# Patient Record
Sex: Female | Born: 1985 | Race: White | Hispanic: No | Marital: Married | Smoking: Never smoker
Health system: Southern US, Community
[De-identification: ages and names within clinical notes are randomized; demographics above are authoritative.]

## PROBLEM LIST (undated history)

## (undated) ENCOUNTER — Inpatient Hospital Stay (HOSPITAL_COMMUNITY): Payer: Self-pay

## (undated) HISTORY — PX: NO PAST SURGERIES: SHX2092

---

## 2013-04-27 LAB — OB RESULTS CONSOLE RPR: RPR: NONREACTIVE

## 2013-04-27 LAB — OB RESULTS CONSOLE HEPATITIS B SURFACE ANTIGEN: Hepatitis B Surface Ag: NEGATIVE

## 2013-05-14 ENCOUNTER — Inpatient Hospital Stay (HOSPITAL_COMMUNITY)
Admission: AD | Admit: 2013-05-14 | Discharge: 2013-05-14 | Disposition: A | Discharge status: Home / Self Care | Payer: BC Managed Care – PPO | Source: Ambulatory Visit | Attending: Obstetrics and Gynecology | Admitting: Obstetrics and Gynecology

## 2013-05-14 ENCOUNTER — Encounter (HOSPITAL_COMMUNITY): Payer: Self-pay | Admitting: *Deleted

## 2013-05-14 ENCOUNTER — Inpatient Hospital Stay (HOSPITAL_COMMUNITY): Payer: BC Managed Care – PPO

## 2013-05-14 DIAGNOSIS — R12 Heartburn: Secondary | ICD-10-CM | POA: Insufficient documentation

## 2013-05-14 DIAGNOSIS — O26859 Spotting complicating pregnancy, unspecified trimester: Secondary | ICD-10-CM | POA: Insufficient documentation

## 2013-05-14 DIAGNOSIS — K219 Gastro-esophageal reflux disease without esophagitis: Secondary | ICD-10-CM

## 2013-05-14 DIAGNOSIS — O209 Hemorrhage in early pregnancy, unspecified: Secondary | ICD-10-CM

## 2013-05-14 DIAGNOSIS — O210 Mild hyperemesis gravidarum: Secondary | ICD-10-CM

## 2013-05-14 DIAGNOSIS — O219 Vomiting of pregnancy, unspecified: Secondary | ICD-10-CM

## 2013-05-14 DIAGNOSIS — O30009 Twin pregnancy, unspecified number of placenta and unspecified number of amniotic sacs, unspecified trimester: Secondary | ICD-10-CM | POA: Insufficient documentation

## 2013-05-14 LAB — URINE MICROSCOPIC-ADD ON

## 2013-05-14 LAB — URINALYSIS, ROUTINE W REFLEX MICROSCOPIC
Ketones, ur: 15 mg/dL — AB
Leukocytes, UA: NEGATIVE
Nitrite: NEGATIVE
Protein, ur: NEGATIVE mg/dL
Urobilinogen, UA: 2 mg/dL — ABNORMAL HIGH (ref 0.0–1.0)
pH: 6 (ref 5.0–8.0)

## 2013-05-14 MED ORDER — FAMOTIDINE 20 MG PO TABS: 20.0000 mg | ORAL_TABLET | Freq: Two times a day (BID) | ORAL | Status: DC

## 2013-05-14 MED ORDER — LACTATED RINGERS IV BOLUS (SEPSIS)
1000.0000 mL | Freq: Once | INTRAVENOUS | Status: AC
  Administered 2013-05-14: 1000 mL via INTRAVENOUS

## 2013-05-14 NOTE — MAU Provider Note (Signed)
History     CSN: [id number]  Arrival date and time: 05/14/13 1046   First Provider Initiated Contact with Patient 05/14/13 1120      Chief Complaint  Patient presents with  . Vaginal Bleeding   HPI Ms. Catherine Padilla is a 27 y.o. G1P0 at [redacted]w[redacted]d with twins who presents to MAU today with complaint of spotting. The patient states that she had some spotting on Friday and then again this morning. Today she noted dark blood with wiping only. She feels there is less now. She is also having mild lower abdominal cramping. She has had N/V consistently throughout the pregnancy that is well-controlled with Zofran. She states that she has not been doing a good job of hydrating herself recently. She is having constipation and takes Miralax. She denies UTI symptoms or fever. Last intercourse was last Sunday.   OB History   Grav Para Term Preterm Abortions TAB SAB Ect Mult Living   1               History reviewed. No pertinent past medical history.  History reviewed. No pertinent past surgical history.  History reviewed. No pertinent family history.  History  Substance Use Topics  . Smoking status: Never Smoker   . Smokeless tobacco: Not on file  . Alcohol Use: No    Allergies: No Known Allergies  Prescriptions prior to admission  Medication Sig Dispense Refill  . Ondansetron HCl (ZOFRAN PO) Take 1 each by mouth daily as needed (neasea).      . polyethylene glycol (MIRALAX / GLYCOLAX) packet Take 17 g by mouth daily.      . Prenatal Vit-Fe Fumarate-FA (PRENATAL MULTIVITAMIN) TABS Take 1 tablet by mouth daily at 12 noon.        Review of Systems  Constitutional: Positive for malaise/fatigue. Negative for fever.  Gastrointestinal: Positive for nausea, vomiting, abdominal pain and constipation. Negative for diarrhea.  Genitourinary: Negative for dysuria, urgency and frequency.       + Vaginal discharge, bleeding  Neurological: Negative for dizziness, loss of consciousness and weakness.    Physical Exam   Blood pressure 131/86, pulse 80, temperature 98.2 F (36.8 C), temperature source Oral, resp. rate 18.  Physical Exam  Constitutional: She is oriented to person, place, and time. She appears well-developed and well-nourished. No distress.  HENT:  Head: Normocephalic and atraumatic.  Cardiovascular: Normal rate, regular rhythm and normal heart sounds.   Respiratory: Effort normal and breath sounds normal.  GI: Soft. Bowel sounds are normal. She exhibits no distension and no mass. There is tenderness (mild tenderness to palpation of the lower abdomen). There is no rebound and no guarding.  Genitourinary: Uterus is enlarged. Uterus is not tender. Cervix exhibits discharge (scant mucus discharge noted at cervical os) and friability. Cervix exhibits no motion tenderness. There is bleeding (scant dark [place] blood noted; some active bleeding when cervix touched with probe) around the vagina. Vaginal discharge (small amount of thick white and mucus discharge noted) found.  Neurological: She is alert and oriented to person, place, and time.  Skin: Skin is warm and dry. No erythema.  Psychiatric: She has a normal mood and affect.   Results for orders placed during the hospital encounter of 05/14/13 (from the past 24 hour(s))  URINALYSIS, ROUTINE W REFLEX MICROSCOPIC     Status: Abnormal   Collection Time    05/14/13 11:05 AM      Result Value Range   Color, Urine YELLOW  YELLOW  APPearance HAZY (*) CLEAR   Specific Gravity, Urine >1.030 (*) 1.005 - 1.030   pH 6.0  5.0 - 8.0   Glucose, UA NEGATIVE  NEGATIVE mg/dL   Hgb urine dipstick LARGE (*) NEGATIVE   Bilirubin Urine SMALL (*) NEGATIVE   Ketones, ur 15 (*) NEGATIVE mg/dL   Protein, ur NEGATIVE  NEGATIVE mg/dL   Urobilinogen, UA 2.0 (*) 0.0 - 1.0 mg/dL   Nitrite NEGATIVE  NEGATIVE   Leukocytes, UA NEGATIVE  NEGATIVE  URINE MICROSCOPIC-ADD ON     Status: Abnormal   Collection Time    05/14/13 11:05 AM      Result Value  Range   Squamous Epithelial / LPF MANY (*) RARE   WBC, UA 0-2  <3 WBC/hpf   RBC / HPF 3-6  <3 RBC/hpf   Bacteria, UA FEW (*) RARE   Urine-Other MUCOUS PRESENT    WET PREP, GENITAL     Status: Abnormal   Collection Time    05/14/13 11:25 AM      Result Value Range   Yeast Wet Prep HPF POC NONE SEEN  NONE SEEN   Trich, Wet Prep NONE SEEN  NONE SEEN   Clue Cells Wet Prep HPF POC NONE SEEN  NONE SEEN   WBC, Wet Prep HPF POC FEW (*) NONE SEEN   US Ob Limited  05/14/2013   *RADIOLOGY REPORT*  Clinical Data: Spotting  LIMITED OBSTETRIC ULTRASOUND  Number of Fetuses: 2 Heart Rate: Twin A:  152 bpm,  Twin B:  155  bpm  Movement: Yes Presentation: Variable Placental Location: Fundal and posterior Amniotic Fluid (Subjective): Normal  MATERNAL FINDINGS: Cervix: Closed measuring 2.8 cm by transabdominal sonogram. Uterus/Adnexae:  Marginal subchorionic hemorrhage noted measuring 3.3 x 0.8 cm.  This is superior to the placenta.  IMPRESSION:  1.  Living twin intrauterine gestations with estimated gestational age of [redacted] weeks and 5 days. 2.  Small subchorionic hemorrhage.  Recommend followup with non-emergent complete OB 14+ wk US examination for fetal biometric evaluation and anatomic survey if not already performed.   Original Report Authenticated By: Signa Kell, M.D.     MAU Course  Procedures None  MDM Discussed with Dr. Henderson Cloud. Send patient for OB US to evaluate placenta and cervix.  1 L LR bolus Discussed [place] results with Dr. Henderson Cloud. Ok for discharge with bleeding precautions and follow-up in the office this week.  Assessment and Plan  A: Bleeding in pregnancy Heartburn Nausea and vomiting in pregnancy prior to [redacted] weeks gestation, well-controlled  P: Discharge home Rx for Pepcid sent to patient's pharmacy Discussed diet for GERD Patient encouraged to keep follow-up appointment as scheduled with [facility] or call for earlier appointment if symptoms should change or  worsen Patient encouraged to increase PO hydration as tolerated Patient may return to MAU as needed  Freddi Starr, PA-C  05/14/2013, 1:10 PM

## 2013-05-14 NOTE — MAU Note (Signed)
Pt reports she started having some dark spotting on Friday that went away yesterday. Having more spotting today and some mild cramping. Pt is [redacted] weeks pregnant with twins.

## 2013-05-15 LAB — GC/CHLAMYDIA PROBE AMP: GC Probe RNA: NEGATIVE

## 2013-05-15 LAB — OB RESULTS CONSOLE GC/CHLAMYDIA
Chlamydia: NEGATIVE
Gonorrhea: NEGATIVE

## 2013-05-18 ENCOUNTER — Inpatient Hospital Stay (HOSPITAL_COMMUNITY)
Admission: AD | Admit: 2013-05-18 | Discharge: 2013-05-19 | Disposition: A | Discharge status: Home / Self Care | Payer: BC Managed Care – PPO | Source: Ambulatory Visit | Attending: Obstetrics and Gynecology | Admitting: Obstetrics and Gynecology

## 2013-05-18 ENCOUNTER — Encounter (HOSPITAL_COMMUNITY): Payer: Self-pay | Admitting: *Deleted

## 2013-05-18 DIAGNOSIS — O210 Mild hyperemesis gravidarum: Secondary | ICD-10-CM | POA: Insufficient documentation

## 2013-05-18 DIAGNOSIS — R42 Dizziness and giddiness: Secondary | ICD-10-CM | POA: Insufficient documentation

## 2013-05-18 DIAGNOSIS — O219 Vomiting of pregnancy, unspecified: Secondary | ICD-10-CM

## 2013-05-18 DIAGNOSIS — O30009 Twin pregnancy, unspecified number of placenta and unspecified number of amniotic sacs, unspecified trimester: Secondary | ICD-10-CM | POA: Insufficient documentation

## 2013-05-18 MED ORDER — ONDANSETRON 8 MG/NS 50 ML IVPB
8.0000 mg | Freq: Once | INTRAVENOUS | Status: DC
  Filled 2013-05-18: qty 8

## 2013-05-18 MED ORDER — LACTATED RINGERS IV BOLUS (SEPSIS)
1000.0000 mL | Freq: Once | INTRAVENOUS | Status: AC
  Administered 2013-05-18: 1000 mL via INTRAVENOUS

## 2013-05-18 MED ORDER — ONDANSETRON HCL 4 MG/2ML IJ SOLN: 8.0000 mg | Freq: Once | INTRAMUSCULAR | Status: DC

## 2013-05-18 NOTE — MAU Note (Signed)
Pt reports about 2 hours ago she became really dizzy and felt like she was going to pass out. Had vomiting after the dizzy episode. She took the meds she had for nausea and that has eased up.

## 2013-05-18 NOTE — MAU Note (Signed)
Pt states she felt dizzy about 2115, pt states she felt light headed after driving home from visiting her sister. Pt states she became naueous and shaky and started "throwing up where i couldn't control it"

## 2013-05-19 DIAGNOSIS — O210 Mild hyperemesis gravidarum: Secondary | ICD-10-CM

## 2013-05-19 NOTE — MAU Provider Note (Signed)
History     CSN: [id number]  Arrival date and time: 05/18/13 2300   None     Chief Complaint  Patient presents with  . Dizziness  . Nausea   HPI  Catherine Padilla is a 27 y.o. G1P0 at [redacted]w[redacted]d with twins. She has had a lot of trouble with nausea and vomiting. Today she was feeling lightheaded and dizzy, and then began to get really shaky. She states that the checked her blood sugar home, and it was normal. She states that her blood pressure was a little high as well. She does not remember what the values were. She states that the nausea has gotten better, but that she was concerned about the shaking.   History reviewed. No pertinent past medical history.  Past Surgical History  Procedure Laterality Date  . No past surgeries      Family History  Problem Relation Age of Onset  . Hypertension Mother   . Hypertension Maternal Grandmother   . Diabetes Paternal Grandmother     History  Substance Use Topics  . Smoking status: Never Smoker   . Smokeless tobacco: Not on file  . Alcohol Use: No    Allergies: No Known Allergies  Prescriptions prior to admission  Medication Sig Dispense Refill  . Ondansetron HCl (ZOFRAN PO) Take 1 each by mouth daily as needed (neasea).      . famotidine (PEPCID) 20 MG tablet Take 1 tablet (20 mg total) by mouth 2 (two) times daily.  30 tablet  0  . polyethylene glycol (MIRALAX / GLYCOLAX) packet Take 17 g by mouth daily.      . Prenatal Vit-Fe Fumarate-FA (PRENATAL MULTIVITAMIN) TABS Take 1 tablet by mouth daily at 12 noon.        Review of Systems  Constitutional: Negative for fever and chills.  Eyes: Negative for blurred vision.  Respiratory: Negative for shortness of breath.   Cardiovascular: Negative for chest pain.  Gastrointestinal: Positive for nausea, vomiting and constipation (has started to get better). Negative for diarrhea.  Genitourinary: Negative for dysuria, urgency and frequency.       She had spotting on Wednesday and she was  seen for that. She last had spotting on Tuesday and has not had any since. She has noticed an increase in vaginal discharge.   Musculoskeletal: Negative for myalgias.  Neurological: Positive for dizziness. Negative for headaches (She has not had a headache today, but she has had some this week. ).  Psychiatric/Behavioral: Negative for depression.   Physical Exam   Blood pressure 123/78, pulse 78, temperature 98.6 F (37 C), temperature source Oral, resp. rate 20, height 5\' 6"  (1.676 m), weight 83.915 kg (185 lb), SpO2 100.00%.  Physical Exam  Nursing note and vitals reviewed. Constitutional: She is oriented to person, place, and time. She appears well-developed and well-nourished. No distress.  Cardiovascular: Normal rate.   Respiratory: Effort normal.  GI: Soft. There is no tenderness.  Neurological: She is alert and oriented to person, place, and time.  Skin: Skin is warm and dry.  Psychiatric: She has a normal mood and affect.    MAU Course  Procedures   Dr. Dareen Piano had informed us of the patients pending arrival. Discussed POC at that time. No deviation from POC required  0143: Patient has had 2L IVF, and is feeling better.   Assessment and Plan   1. Nausea and vomiting in pregnancy prior to [redacted] weeks gestation   2. Episodic lightheadedness    Discussed  physiologic changes in pregnancy that can cause dizziness Encouraged increased PO hydration and antiemetic as needed FU with Dr. Dareen Piano as scheduled.   Tawnya Crook 05/19/2013, 12:22 AM

## 2013-06-06 ENCOUNTER — Other Ambulatory Visit: Payer: Self-pay

## 2013-06-06 ENCOUNTER — Other Ambulatory Visit (HOSPITAL_COMMUNITY): Payer: Self-pay | Admitting: Obstetrics and Gynecology

## 2013-06-06 DIAGNOSIS — O30039 Twin pregnancy, monochorionic/diamniotic, unspecified trimester: Secondary | ICD-10-CM

## 2013-06-07 ENCOUNTER — Encounter (HOSPITAL_COMMUNITY): Payer: Self-pay

## 2013-06-07 ENCOUNTER — Other Ambulatory Visit: Payer: Self-pay

## 2013-06-07 ENCOUNTER — Other Ambulatory Visit (HOSPITAL_COMMUNITY): Payer: Self-pay | Admitting: Obstetrics and Gynecology

## 2013-06-07 ENCOUNTER — Ambulatory Visit (HOSPITAL_COMMUNITY)
Admission: RE | Admit: 2013-06-07 | Discharge: 2013-06-07 | Disposition: A | Discharge status: Home / Self Care | Payer: BC Managed Care – PPO | Source: Ambulatory Visit | Attending: Obstetrics and Gynecology | Admitting: Obstetrics and Gynecology

## 2013-06-07 VITALS — BP 127/80 | HR 85 | Wt 181.5 lb

## 2013-06-07 DIAGNOSIS — Z363 Encounter for antenatal screening for malformations: Secondary | ICD-10-CM | POA: Insufficient documentation

## 2013-06-07 DIAGNOSIS — O360990 Maternal care for other rhesus isoimmunization, unspecified trimester, not applicable or unspecified: Secondary | ICD-10-CM | POA: Insufficient documentation

## 2013-06-07 DIAGNOSIS — O358XX0 Maternal care for other (suspected) fetal abnormality and damage, not applicable or unspecified: Secondary | ICD-10-CM | POA: Insufficient documentation

## 2013-06-07 DIAGNOSIS — O30039 Twin pregnancy, monochorionic/diamniotic, unspecified trimester: Secondary | ICD-10-CM

## 2013-06-07 DIAGNOSIS — Z1389 Encounter for screening for other disorder: Secondary | ICD-10-CM | POA: Insufficient documentation

## 2013-06-07 DIAGNOSIS — O30009 Twin pregnancy, unspecified number of placenta and unspecified number of amniotic sacs, unspecified trimester: Secondary | ICD-10-CM | POA: Insufficient documentation

## 2013-06-07 NOTE — Progress Notes (Signed)
KLAIR LEISING  was seen today for an ultrasound appointment.  See full report in [facility].  Comments: Catherine Padilla was seen today due to MC/DA twins with echogenic bowel and growth lag of Twin B.  She had viral serologies drawn yesterday after her ultrasound.  On exam today, a thin separating membrane is noted.  There is about a 1 1/2 week growth lag of Twin B.  Echogenic bowel is appreciated in Twin B that appears as echogenic as adjacent bone.  Some amniotic fluid discordance is noted between both twins.  The Max vercicle pocket around twin A is 4 cm.  The Max verticle pocket around B is over 2 cm, but appears subjectively decreased.  Fetal bladders are clearly visualized in both twins.  Normal umbilical artery waverforms are appreciated in both twins.  We reviewed the differential diagnosis for echogenic bowel to include viral infections, aneuploidy (soft marker for Down syndrome), CF and swollowed blood.  The patient elected to undergo NIPS (cell free fetal DNA) to evaluate for possible aneuploidy.  We also reviewed TTTS, and although criteria for this diagnosis is not currently met, with the growth discordance and fluid discordance feel that close surveillance is warranted.  Impression: MC/DA twin gestation with best dates of 18 0/7 weeks 25% intertwin growth discordance is noted.  Twin A:  Maternal left, female fetus, posterior placenta Normal anatomic fetal survey Fetal bladder visualized.  Normal umbilical artery Doppler waverform. Normal amniotic fluid volume.  Twin B: Maternal right, Breech, posterior placenta Normal fetal anatomy; limited views of the face (lips/nose) and spine obtained due to fetal position Growth lag noted (1-1/1/2 weeks) Amniotic fluid subjectively decreased Normal bladder, normal umbilical artery Doppler waveform  Recommendations: Recommend follow up ultrasound in 1 week to screen for TTTS due to fluid and size discordance (see comments above). If stable, may  decrease frequency of surveillance. Follow up growth scan in 3-4 weeks.  Alpha Gula, MD

## 2013-06-13 ENCOUNTER — Other Ambulatory Visit (HOSPITAL_COMMUNITY): Payer: Self-pay | Admitting: Obstetrics and Gynecology

## 2013-06-13 ENCOUNTER — Other Ambulatory Visit (HOSPITAL_COMMUNITY): Payer: Self-pay | Admitting: Medical Oncology

## 2013-06-13 DIAGNOSIS — O30009 Twin pregnancy, unspecified number of placenta and unspecified number of amniotic sacs, unspecified trimester: Secondary | ICD-10-CM

## 2013-06-14 ENCOUNTER — Encounter (HOSPITAL_COMMUNITY): Payer: Self-pay

## 2013-06-14 ENCOUNTER — Ambulatory Visit (HOSPITAL_COMMUNITY)
Admission: RE | Admit: 2013-06-14 | Discharge: 2013-06-14 | Disposition: A | Discharge status: Home / Self Care | Payer: BC Managed Care – PPO | Source: Ambulatory Visit | Attending: Obstetrics and Gynecology | Admitting: Obstetrics and Gynecology

## 2013-06-14 ENCOUNTER — Other Ambulatory Visit (HOSPITAL_COMMUNITY): Payer: Self-pay | Admitting: Obstetrics and Gynecology

## 2013-06-14 VITALS — BP 115/61 | HR 60 | Wt 184.5 lb

## 2013-06-14 DIAGNOSIS — O358XX0 Maternal care for other (suspected) fetal abnormality and damage, not applicable or unspecified: Secondary | ICD-10-CM | POA: Insufficient documentation

## 2013-06-14 DIAGNOSIS — O30009 Twin pregnancy, unspecified number of placenta and unspecified number of amniotic sacs, unspecified trimester: Secondary | ICD-10-CM

## 2013-06-14 DIAGNOSIS — O30032 Twin pregnancy, monochorionic/diamniotic, second trimester: Secondary | ICD-10-CM

## 2013-06-14 DIAGNOSIS — O365990 Maternal care for other known or suspected poor fetal growth, unspecified trimester, not applicable or unspecified: Secondary | ICD-10-CM | POA: Insufficient documentation

## 2013-06-14 DIAGNOSIS — O4100X0 Oligohydramnios, unspecified trimester, not applicable or unspecified: Secondary | ICD-10-CM | POA: Insufficient documentation

## 2013-06-14 DIAGNOSIS — O30039 Twin pregnancy, monochorionic/diamniotic, unspecified trimester: Secondary | ICD-10-CM

## 2013-06-14 NOTE — Progress Notes (Signed)
[facility] ultrasound  Indication: 27 yr old G1P0 at [redacted]w[redacted]d with monochorionic/diamniotic twin gestation, twin B with lagging growth and echogenic bowel for fetal ultrasound.  Findings: 1. Monochorionic/diamniotic twin gestation; the dividing membrane is seen. 2. Posterior placenta without evidence of previa. 3. Twin A with normal maximum vertical pocket of amniotic fluid; although at 7.6cm is increased for gestational age. Twin B with oligohydramnios with maximum vertical pocket of amniotic fluid of 1.09cm. 4. Normal transvaginal cervical length. 5. Normal stomach and bladder seen in each fetus. 6. Echogenic bowel is again seen in twin B. 7. Twin A has normal umbilical artery Doppler studies; twin B with elevated systolic/diastolic ratio; there is persistent forward flow. 8. Twin B has a velamentous placental cord insertion.  Recommendations: 1. Monochorionic/diamniotic twin gestation: - previously counseled - discussed with fluid and weight discrepancy along with abnormal umbilical artery Doppler studies in twin B there is concern for possible twin twin transfusion syndrome vs twin B with placental insufficiency - discussed at this point I do not feel she meets criteria for TTTS (spoke with Dr. Elisabeth Most from [facility] and she is in agreement) and do not feel she qualifies for intervention at this time - discussed clinical picture is more consistent with placental insufficiency (given normal bladder in twin B and no polyhydramnios in twin A) for twin B for which there is no intervention - regardless this warrants close clinical surveillance - I have recommended the patient stay off of work and follow up on 06/16/13- if clinical picture stable and no evidence of TTTS I feel the patient can return to work - I have also recommended that the patient start an ensure once a day as there is some data that this may be of benefit in TTTS and there is no downside -  discussed if this is felt to be placental insufficiency that we will follow growth closely, but there is no role for decision making prior to viability; once viability is reached will readdress and consider a course of betamethasone and other interventions as appropriate at that time - discussed implications of fetal demise on surviving twin - recommend a fetal echocardiogram asap to help determine if any effects or any cardiac signs of TTTS (tenatively scheduled for 06/16/13) - recommend fetal growth in 1 week  2. Echogenic bowel in twin B: - previously counseled - had viral studies done with primary OB as well as cell free fetal DNA and cystic fibrosis screening- awaiting results - continue to follow fetal growth  Eulis Foster, MD

## 2013-06-14 NOTE — Consult Note (Signed)
MFM consult   27 yr old G1P0 at [redacted]w[redacted]d with monochorionic/diamniotic twin gestation, twin B with lagging growth and echogenic bowel referred by Dr. Tenny Craw for fetal ultrasound and consult.  Ultrasound today shows: monochorionic/diamniotic twin gestation; the dividing membrane is seen. Posterior placenta without evidence of previa. Twin A with normal maximum vertical pocket of amniotic fluid; although at 7.6cm is increased for gestational age. Twin B with oligohydramnios with maximum vertical pocket of amniotic fluid of 1.09cm. Normal transvaginal cervical length. Normal stomach and bladder seen in each fetus. Echogenic bowel is again seen in twin B. Twin A has normal umbilical artery Doppler studies; twin B with elevated systolic/diastolic ratio; there is persistent forward flow. Twin B has a velamentous placental cord insertion.  I counseled the patient as follows: 1. Monochorionic/diamniotic twin gestation: - previously counseled - discussed with fluid and weight discrepancy along with abnormal umbilical artery Doppler studies in twin B there is concern for possible twin twin transfusion syndrome vs twin B with placental insufficiency - discussed at this point I do not feel she meets criteria for TTTS (spoke with Dr. Elisabeth Most from [facility] and she is in agreement) and do not feel she qualifies for intervention at this time - discussed clinical picture is more consistent with placental insufficiency (given normal bladder in twin B and no polyhydramnios in twin A) for twin B for which there is no intervention - regardless this warrants close clinical surveillance - I have recommended the patient stay off of work and follow up on 06/16/13- if clinical picture stable and no evidence of TTTS I feel the patient can return to work - I have also recommended that the patient start an ensure once a day as there is some data that this may be of benefit in TTTS and there is no downside - discussed  if this is felt to be placental insufficiency that we will follow growth closely, but there is no role for decision making prior to viability; once viability is reached will readdress and consider a course of betamethasone and other interventions as appropriate at that time - discussed implications of fetal demise on surviving twin - recommend a fetal echocardiogram asap to help determine if any effects or any cardiac signs of TTTS (tenatively scheduled for 06/16/13) - recommend fetal growth in 1 week  2. Echogenic bowel in twin B: - previously counseled - had viral studies done with primary OB as well as cell free fetal DNA and cystic fibrosis screening- awaiting results - continue to follow fetal growth  I spent 30 minutes in face to face consultation with the patient in addition to time spent on the ultrasound.  Eulis Foster, MD

## 2013-06-16 ENCOUNTER — Other Ambulatory Visit (HOSPITAL_COMMUNITY): Payer: Self-pay | Admitting: Obstetrics and Gynecology

## 2013-06-16 ENCOUNTER — Other Ambulatory Visit: Payer: Self-pay

## 2013-06-16 ENCOUNTER — Ambulatory Visit (HOSPITAL_COMMUNITY)
Admission: RE | Admit: 2013-06-16 | Discharge: 2013-06-16 | Disposition: A | Discharge status: Home / Self Care | Payer: BC Managed Care – PPO | Source: Ambulatory Visit | Attending: Obstetrics and Gynecology | Admitting: Obstetrics and Gynecology

## 2013-06-16 DIAGNOSIS — O30032 Twin pregnancy, monochorionic/diamniotic, second trimester: Secondary | ICD-10-CM

## 2013-06-16 DIAGNOSIS — O26899 Other specified pregnancy related conditions, unspecified trimester: Secondary | ICD-10-CM

## 2013-06-16 DIAGNOSIS — O4100X0 Oligohydramnios, unspecified trimester, not applicable or unspecified: Secondary | ICD-10-CM | POA: Insufficient documentation

## 2013-06-16 DIAGNOSIS — O30009 Twin pregnancy, unspecified number of placenta and unspecified number of amniotic sacs, unspecified trimester: Secondary | ICD-10-CM | POA: Insufficient documentation

## 2013-06-16 DIAGNOSIS — O365990 Maternal care for other known or suspected poor fetal growth, unspecified trimester, not applicable or unspecified: Secondary | ICD-10-CM | POA: Insufficient documentation

## 2013-06-16 DIAGNOSIS — O358XX0 Maternal care for other (suspected) fetal abnormality and damage, not applicable or unspecified: Secondary | ICD-10-CM | POA: Insufficient documentation

## 2013-06-16 NOTE — Progress Notes (Signed)
Catherine Padilla  was seen today for an ultrasound appointment.  See full report in [facility].  Comments: Catherine Padilla returns for follow up due to MC/DA twins with echogenic bowel and an approximately 2 week growth lag of twin B.  She was seen earlier this week - noted to have oligohydramnios of Twin B with elevated UA Dopplers but no evidence of polyhydramnios of Twin A.  Initial impression was that this was likely a placental etiology rather than early TTTS. Toxo, CMV serologies and CF mutations were negative.  Cell free fetal DNA testing is pending.  On exam today, polyhydramnios is noted with Twin A (max verticle pocket of 8 cm).  Oligohydramnios is noted on Twin B (max verticle pocket 1-1.9 cm).  Bladders are visualized in both twins- albeit small in Twin B.  UA Dopplers are somewhat improved compared to earlier this week without evidence of absent or reversed diastolic flow.  Both twins had normal ductus venosus waveforms.  Given the new findings of polyhydramnios of Twin A, have made arrangements for Catherine Padilla to be seen at the [facility] next week.  I suspect that this is most likly a hybrid presentation - early stage TTTS but also some element of placental insufficiency as the etilogy of fetal growth restriction in Twin B.  Impression: MC/DA twin gestation at 74 2/7 weeks Discordant growth with 2 week growth lag of Twin B ( on 7/17) Mild polyhydramnios noted in Twin A (max verticle pocket 8 cm) Oligohydramnios noted in Twin B (max verticle pocket 1-1.9 cm) No absent or reversed diastolic flow on UA Dopplers on either twin. Normal ductus venosus waveforms Bladders visualized in both Twins.  Recommendations: Catherine Padilla will be evaluated at the [facility] center early next week - they prefer to perform fetal echos there (will cancel scheduled fetal echos that were previously scheduled) Follow up for limited ultrasound here next week Growth scan in 2 weeks.  Alpha Gula, MD

## 2013-06-19 ENCOUNTER — Telehealth (HOSPITAL_COMMUNITY): Payer: Self-pay | Admitting: MS"

## 2013-06-19 NOTE — Telephone Encounter (Signed)
Calling patient regarding normal cell free fetal DNA testing result. Left message for patient to return call.   Clydie Braun Kalon Erhardt 06/19/2013 9:41 AM    Called Mickle Asper to discuss her cell free fetal DNA test results.  Mrs. ARDELLA CHHIM had [facility] testing through [facility].  Testing was offered because of previous ultrasound finding of echogenic bowel in twin B.   The patient was identified by name and DOB.  We reviewed that these are within normal limits, showing a less than 1 in 10,000 risk for trisomies 21, 18 and 13.  This testing identifies > 99% of pregnancies with trisomy 21, >98% of pregnancies with trisomy 6, and approximately 80% of pregnancies with trisomy 7. Detection rate is decreased in a twin gestation, and sex chromosome assessment is not performed in a twin gestation.  The false positive rate is <0.1% for all conditions. She understands that this testing does not identify all genetic conditions.  All questions were answered to her satisfaction, she was encouraged to call with additional questions or concerns.  Quinn Plowman, MS Certified Genetic Counselor 06/19/2013 11:10 AM

## 2013-06-20 ENCOUNTER — Other Ambulatory Visit (HOSPITAL_COMMUNITY): Payer: Self-pay | Admitting: Obstetrics and Gynecology

## 2013-06-20 ENCOUNTER — Other Ambulatory Visit: Payer: Self-pay

## 2013-06-20 DIAGNOSIS — O30032 Twin pregnancy, monochorionic/diamniotic, second trimester: Secondary | ICD-10-CM

## 2013-06-21 ENCOUNTER — Ambulatory Visit (HOSPITAL_COMMUNITY)
Admission: RE | Admit: 2013-06-21 | Discharge: 2013-06-21 | Disposition: A | Discharge status: Home / Self Care | Payer: BC Managed Care – PPO | Source: Ambulatory Visit | Attending: Obstetrics and Gynecology | Admitting: Obstetrics and Gynecology

## 2013-06-21 DIAGNOSIS — O30032 Twin pregnancy, monochorionic/diamniotic, second trimester: Secondary | ICD-10-CM

## 2013-06-21 DIAGNOSIS — O4100X0 Oligohydramnios, unspecified trimester, not applicable or unspecified: Secondary | ICD-10-CM | POA: Insufficient documentation

## 2013-06-21 DIAGNOSIS — O365990 Maternal care for other known or suspected poor fetal growth, unspecified trimester, not applicable or unspecified: Secondary | ICD-10-CM | POA: Insufficient documentation

## 2013-06-21 DIAGNOSIS — O358XX0 Maternal care for other (suspected) fetal abnormality and damage, not applicable or unspecified: Secondary | ICD-10-CM | POA: Insufficient documentation

## 2013-06-21 DIAGNOSIS — O30009 Twin pregnancy, unspecified number of placenta and unspecified number of amniotic sacs, unspecified trimester: Secondary | ICD-10-CM | POA: Insufficient documentation

## 2013-06-28 ENCOUNTER — Ambulatory Visit (HOSPITAL_COMMUNITY): Payer: BC Managed Care – PPO

## 2013-06-29 ENCOUNTER — Other Ambulatory Visit (HOSPITAL_COMMUNITY): Payer: Self-pay | Admitting: Obstetrics and Gynecology

## 2013-06-29 DIAGNOSIS — O30039 Twin pregnancy, monochorionic/diamniotic, unspecified trimester: Secondary | ICD-10-CM

## 2013-06-30 ENCOUNTER — Ambulatory Visit (HOSPITAL_COMMUNITY)
Admission: RE | Admit: 2013-06-30 | Discharge: 2013-06-30 | Disposition: A | Discharge status: Home / Self Care | Payer: BC Managed Care – PPO | Source: Ambulatory Visit | Attending: Obstetrics and Gynecology | Admitting: Obstetrics and Gynecology

## 2013-06-30 ENCOUNTER — Other Ambulatory Visit (HOSPITAL_COMMUNITY): Payer: Self-pay | Admitting: Obstetrics and Gynecology

## 2013-06-30 DIAGNOSIS — O4100X0 Oligohydramnios, unspecified trimester, not applicable or unspecified: Secondary | ICD-10-CM | POA: Insufficient documentation

## 2013-06-30 DIAGNOSIS — O365990 Maternal care for other known or suspected poor fetal growth, unspecified trimester, not applicable or unspecified: Secondary | ICD-10-CM | POA: Insufficient documentation

## 2013-06-30 DIAGNOSIS — O30039 Twin pregnancy, monochorionic/diamniotic, unspecified trimester: Secondary | ICD-10-CM

## 2013-06-30 DIAGNOSIS — O358XX0 Maternal care for other (suspected) fetal abnormality and damage, not applicable or unspecified: Secondary | ICD-10-CM | POA: Insufficient documentation

## 2013-06-30 DIAGNOSIS — O30009 Twin pregnancy, unspecified number of placenta and unspecified number of amniotic sacs, unspecified trimester: Secondary | ICD-10-CM | POA: Insufficient documentation

## 2013-07-05 ENCOUNTER — Ambulatory Visit (HOSPITAL_COMMUNITY): Payer: BC Managed Care – PPO

## 2013-07-06 ENCOUNTER — Other Ambulatory Visit (HOSPITAL_COMMUNITY): Payer: Self-pay | Admitting: Obstetrics and Gynecology

## 2013-07-06 DIAGNOSIS — O30009 Twin pregnancy, unspecified number of placenta and unspecified number of amniotic sacs, unspecified trimester: Secondary | ICD-10-CM

## 2013-07-07 ENCOUNTER — Ambulatory Visit (HOSPITAL_COMMUNITY)
Admission: RE | Admit: 2013-07-07 | Discharge: 2013-07-07 | Disposition: A | Discharge status: Home / Self Care | Payer: BC Managed Care – PPO | Source: Ambulatory Visit | Attending: Obstetrics and Gynecology | Admitting: Obstetrics and Gynecology

## 2013-07-07 ENCOUNTER — Encounter (HOSPITAL_COMMUNITY): Payer: Self-pay

## 2013-07-07 DIAGNOSIS — O358XX0 Maternal care for other (suspected) fetal abnormality and damage, not applicable or unspecified: Secondary | ICD-10-CM | POA: Insufficient documentation

## 2013-07-07 DIAGNOSIS — O365990 Maternal care for other known or suspected poor fetal growth, unspecified trimester, not applicable or unspecified: Secondary | ICD-10-CM | POA: Insufficient documentation

## 2013-07-07 DIAGNOSIS — O4100X0 Oligohydramnios, unspecified trimester, not applicable or unspecified: Secondary | ICD-10-CM | POA: Insufficient documentation

## 2013-07-07 DIAGNOSIS — O30009 Twin pregnancy, unspecified number of placenta and unspecified number of amniotic sacs, unspecified trimester: Secondary | ICD-10-CM | POA: Insufficient documentation

## 2013-07-12 ENCOUNTER — Other Ambulatory Visit (HOSPITAL_COMMUNITY): Payer: Self-pay | Admitting: Obstetrics and Gynecology

## 2013-07-12 DIAGNOSIS — O30009 Twin pregnancy, unspecified number of placenta and unspecified number of amniotic sacs, unspecified trimester: Secondary | ICD-10-CM

## 2013-07-14 ENCOUNTER — Ambulatory Visit (HOSPITAL_COMMUNITY)
Admission: RE | Admit: 2013-07-14 | Discharge: 2013-07-14 | Disposition: A | Discharge status: Home / Self Care | Payer: BC Managed Care – PPO | Source: Ambulatory Visit | Attending: Obstetrics and Gynecology | Admitting: Obstetrics and Gynecology

## 2013-07-14 ENCOUNTER — Other Ambulatory Visit (HOSPITAL_COMMUNITY): Payer: Self-pay | Admitting: Obstetrics and Gynecology

## 2013-07-14 VITALS — BP 124/81 | HR 80 | Wt 184.5 lb

## 2013-07-14 DIAGNOSIS — O365990 Maternal care for other known or suspected poor fetal growth, unspecified trimester, not applicable or unspecified: Secondary | ICD-10-CM | POA: Insufficient documentation

## 2013-07-14 DIAGNOSIS — O358XX0 Maternal care for other (suspected) fetal abnormality and damage, not applicable or unspecified: Secondary | ICD-10-CM | POA: Insufficient documentation

## 2013-07-14 DIAGNOSIS — O4100X0 Oligohydramnios, unspecified trimester, not applicable or unspecified: Secondary | ICD-10-CM | POA: Insufficient documentation

## 2013-07-14 DIAGNOSIS — O30009 Twin pregnancy, unspecified number of placenta and unspecified number of amniotic sacs, unspecified trimester: Secondary | ICD-10-CM

## 2013-07-14 NOTE — Progress Notes (Signed)
Catherine Padilla  was seen today for an ultrasound appointment.  See full report in [facility].  Impression: MC/DA twin gestation at 83 2/7 weeks Twin B with fetal growth restriction   A: Normal amniotic fluid volume (max verticle pocket 7 cm)        Bladder visible.      Normal UA Doppler studies.  B: Subjectively decreased fluid (max verticle pocket 2.3 cm)      Bladder visible      Elevated UA Dopplers for gestational age.  No absent or reversed diastolic flow  Recommendations: Continue weekly follow up. Interval growth and Doppler studies next week.  Consider betamethasone series next week.  Alpha Gula, MD

## 2013-07-19 ENCOUNTER — Other Ambulatory Visit: Payer: Self-pay | Admitting: Obstetrics and Gynecology

## 2013-07-20 ENCOUNTER — Other Ambulatory Visit (HOSPITAL_COMMUNITY): Payer: Self-pay | Admitting: Maternal and Fetal Medicine

## 2013-07-20 DIAGNOSIS — O30009 Twin pregnancy, unspecified number of placenta and unspecified number of amniotic sacs, unspecified trimester: Secondary | ICD-10-CM

## 2013-07-21 ENCOUNTER — Ambulatory Visit (HOSPITAL_COMMUNITY)
Admission: RE | Admit: 2013-07-21 | Discharge: 2013-07-21 | Disposition: A | Discharge status: Home / Self Care | Payer: BC Managed Care – PPO | Source: Ambulatory Visit | Attending: Obstetrics and Gynecology | Admitting: Obstetrics and Gynecology

## 2013-07-21 ENCOUNTER — Ambulatory Visit (HOSPITAL_COMMUNITY)
Admission: RE | Admit: 2013-07-21 | Discharge: 2013-07-21 | Disposition: A | Discharge status: Home / Self Care | Payer: BC Managed Care – PPO | Source: Ambulatory Visit

## 2013-07-21 VITALS — BP 140/87 | HR 93 | Wt 188.0 lb

## 2013-07-21 DIAGNOSIS — O30009 Twin pregnancy, unspecified number of placenta and unspecified number of amniotic sacs, unspecified trimester: Secondary | ICD-10-CM | POA: Insufficient documentation

## 2013-07-21 DIAGNOSIS — O365990 Maternal care for other known or suspected poor fetal growth, unspecified trimester, not applicable or unspecified: Secondary | ICD-10-CM | POA: Insufficient documentation

## 2013-07-21 DIAGNOSIS — O358XX0 Maternal care for other (suspected) fetal abnormality and damage, not applicable or unspecified: Secondary | ICD-10-CM | POA: Insufficient documentation

## 2013-07-21 DIAGNOSIS — O4100X0 Oligohydramnios, unspecified trimester, not applicable or unspecified: Secondary | ICD-10-CM | POA: Insufficient documentation

## 2013-07-21 MED ORDER — BETAMETHASONE SOD PHOS & ACET 6 (3-3) MG/ML IJ SUSP
12.0000 mg | Freq: Once | INTRAMUSCULAR | Status: AC
  Administered 2013-07-21: 12 mg via INTRAMUSCULAR
  Filled 2013-07-21: qty 2

## 2013-07-21 NOTE — ED Notes (Signed)
1st dose of BMZ given.  Pt to return to MAU tomorrow for 2nd dose.  Orders signed and held.

## 2013-07-21 NOTE — Progress Notes (Signed)
Catherine Padilla  was seen today for an ultrasound appointment.  See full report in [facility].  Impression: MC/DA twin gestation at 1 2/7 weeks Twin B with fetal growth restriction - previously seen at [facility] - not felt to be consistent with TTTS  A: maternal left, cephalic, posterior placenta      Fetal growth is appropriate (43rd %tile)- 230 g weight gain over 3 weeks      Normal amniotic fluid volume      Normal UA Dopplers for gestational age      Normal bladder visualized  B: maternal right, transverse, posterior placenta      Velamentous placental cord insertion      Estimated fetal weight < 10th %tile (110 g weight gain over 3 weeks)      All biometric measurements < 5th %tile      Subjectively low amniotic fluid volume (max verticle pocket 3 cm)      UA Dopplers normal for gestational age.      Normal Bladder visualized  [name] was given a dose of betamethasone following her clinic visit and will return to MAU tomorrow for her second dose  Recommendations: Recommend follow up next week for UA Dopplers and BPPs. If UA Dopplers elevated, will begin 2x weekly surveillance.    Alpha Gula, MD

## 2013-07-22 ENCOUNTER — Inpatient Hospital Stay (HOSPITAL_COMMUNITY)
Admission: AD | Admit: 2013-07-22 | Discharge: 2013-07-22 | Disposition: A | Discharge status: Home / Self Care | Payer: BC Managed Care – PPO | Source: Ambulatory Visit | Attending: Obstetrics and Gynecology | Admitting: Obstetrics and Gynecology

## 2013-07-22 ENCOUNTER — Ambulatory Visit (HOSPITAL_COMMUNITY): Payer: BC Managed Care – PPO

## 2013-07-22 DIAGNOSIS — O4700 False labor before 37 completed weeks of gestation, unspecified trimester: Secondary | ICD-10-CM | POA: Insufficient documentation

## 2013-07-22 MED ORDER — BETAMETHASONE SOD PHOS & ACET 6 (3-3) MG/ML IJ SUSP
12.0000 mg | Freq: Once | INTRAMUSCULAR | Status: AC
  Administered 2013-07-22: 12 mg via INTRAMUSCULAR
  Filled 2013-07-22: qty 2

## 2013-07-28 ENCOUNTER — Encounter (HOSPITAL_COMMUNITY): Payer: Self-pay

## 2013-07-28 ENCOUNTER — Ambulatory Visit (HOSPITAL_COMMUNITY)
Admission: RE | Admit: 2013-07-28 | Discharge: 2013-07-28 | Disposition: A | Discharge status: Home / Self Care | Payer: BC Managed Care – PPO | Source: Ambulatory Visit | Attending: Obstetrics and Gynecology | Admitting: Obstetrics and Gynecology

## 2013-07-28 DIAGNOSIS — O30009 Twin pregnancy, unspecified number of placenta and unspecified number of amniotic sacs, unspecified trimester: Secondary | ICD-10-CM | POA: Insufficient documentation

## 2013-07-28 DIAGNOSIS — O4100X0 Oligohydramnios, unspecified trimester, not applicable or unspecified: Secondary | ICD-10-CM | POA: Insufficient documentation

## 2013-07-28 DIAGNOSIS — O365990 Maternal care for other known or suspected poor fetal growth, unspecified trimester, not applicable or unspecified: Secondary | ICD-10-CM | POA: Insufficient documentation

## 2013-07-28 DIAGNOSIS — O358XX0 Maternal care for other (suspected) fetal abnormality and damage, not applicable or unspecified: Secondary | ICD-10-CM | POA: Insufficient documentation

## 2013-07-31 ENCOUNTER — Other Ambulatory Visit (HOSPITAL_COMMUNITY): Payer: Self-pay | Admitting: Maternal and Fetal Medicine

## 2013-07-31 DIAGNOSIS — O30009 Twin pregnancy, unspecified number of placenta and unspecified number of amniotic sacs, unspecified trimester: Secondary | ICD-10-CM

## 2013-08-04 ENCOUNTER — Ambulatory Visit (HOSPITAL_COMMUNITY)
Admission: RE | Admit: 2013-08-04 | Discharge: 2013-08-04 | Disposition: A | Discharge status: Home / Self Care | Payer: BC Managed Care – PPO | Source: Ambulatory Visit | Attending: Obstetrics and Gynecology | Admitting: Obstetrics and Gynecology

## 2013-08-04 DIAGNOSIS — O365990 Maternal care for other known or suspected poor fetal growth, unspecified trimester, not applicable or unspecified: Secondary | ICD-10-CM | POA: Insufficient documentation

## 2013-08-04 DIAGNOSIS — O4100X0 Oligohydramnios, unspecified trimester, not applicable or unspecified: Secondary | ICD-10-CM | POA: Insufficient documentation

## 2013-08-04 DIAGNOSIS — O30009 Twin pregnancy, unspecified number of placenta and unspecified number of amniotic sacs, unspecified trimester: Secondary | ICD-10-CM | POA: Insufficient documentation

## 2013-08-04 DIAGNOSIS — O358XX0 Maternal care for other (suspected) fetal abnormality and damage, not applicable or unspecified: Secondary | ICD-10-CM | POA: Insufficient documentation

## 2013-08-04 NOTE — Progress Notes (Signed)
Catherine Padilla was seen for ultrasound appointment today.  Please see [facility] report for details.

## 2013-08-10 LAB — OB RESULTS CONSOLE ABO/RH

## 2013-08-10 LAB — OB RESULTS CONSOLE ANTIBODY SCREEN: Antibody Screen: NEGATIVE

## 2013-08-11 ENCOUNTER — Encounter (HOSPITAL_COMMUNITY): Payer: Self-pay

## 2013-08-11 ENCOUNTER — Other Ambulatory Visit (HOSPITAL_COMMUNITY): Payer: Self-pay | Admitting: Maternal and Fetal Medicine

## 2013-08-11 ENCOUNTER — Ambulatory Visit (HOSPITAL_COMMUNITY)
Admission: RE | Admit: 2013-08-11 | Discharge: 2013-08-11 | Disposition: A | Discharge status: Home / Self Care | Payer: BC Managed Care – PPO | Source: Ambulatory Visit | Attending: Obstetrics and Gynecology | Admitting: Obstetrics and Gynecology

## 2013-08-11 VITALS — BP 131/85 | HR 90 | Wt 192.0 lb

## 2013-08-11 DIAGNOSIS — IMO0002 Reserved for concepts with insufficient information to code with codable children: Secondary | ICD-10-CM

## 2013-08-11 DIAGNOSIS — O4100X0 Oligohydramnios, unspecified trimester, not applicable or unspecified: Secondary | ICD-10-CM | POA: Insufficient documentation

## 2013-08-11 DIAGNOSIS — O30009 Twin pregnancy, unspecified number of placenta and unspecified number of amniotic sacs, unspecified trimester: Secondary | ICD-10-CM | POA: Insufficient documentation

## 2013-08-11 DIAGNOSIS — O358XX0 Maternal care for other (suspected) fetal abnormality and damage, not applicable or unspecified: Secondary | ICD-10-CM | POA: Insufficient documentation

## 2013-08-11 DIAGNOSIS — O365990 Maternal care for other known or suspected poor fetal growth, unspecified trimester, not applicable or unspecified: Secondary | ICD-10-CM | POA: Insufficient documentation

## 2013-08-11 NOTE — Progress Notes (Signed)
Catherine Padilla  was seen today for an ultrasound appointment.  See full report in [facility].  Impression: MC/DA twin gestation at 28 2/7 weeks Twin B with fetal growth restriction - previously seen at [facility] - not felt to be consistent with TTTS Completed course of betamethasone at 24 weeks  A: maternal left, cephalic, posterior placenta      Fetal growth is appropriate (25th %tile)- 238 g weight gain over 3 weeks      Normal amniotic fluid volume      Normal UA Dopplers for gestational age      Normal bladder visualized  B: maternal right, breech, posterior placenta      Velamentous placental cord insertion      Estimated fetal weight < 10th %tile (96 g weight gain over 3 weeks)      All biometric measurements < 5th %tile      Nomral amniotic fluid volume      Elevated UA Dopplers for gestaional age, but no evidence of absent or reversed diastolic flow      Normal Bladder visualized  Recommendations: Recommend follow up next week for UA Dopplers and BPPs. Follow up growth scan in 3 weeks.  Alpha Gula, MD

## 2013-08-18 ENCOUNTER — Ambulatory Visit (HOSPITAL_COMMUNITY)
Admission: RE | Admit: 2013-08-18 | Discharge: 2013-08-18 | Disposition: A | Discharge status: Home / Self Care | Payer: BC Managed Care – PPO | Source: Ambulatory Visit | Attending: Obstetrics and Gynecology | Admitting: Obstetrics and Gynecology

## 2013-08-18 ENCOUNTER — Other Ambulatory Visit (HOSPITAL_COMMUNITY): Payer: Self-pay | Admitting: Maternal and Fetal Medicine

## 2013-08-18 DIAGNOSIS — IMO0002 Reserved for concepts with insufficient information to code with codable children: Secondary | ICD-10-CM

## 2013-08-18 DIAGNOSIS — O30009 Twin pregnancy, unspecified number of placenta and unspecified number of amniotic sacs, unspecified trimester: Secondary | ICD-10-CM

## 2013-08-18 DIAGNOSIS — O4100X0 Oligohydramnios, unspecified trimester, not applicable or unspecified: Secondary | ICD-10-CM | POA: Insufficient documentation

## 2013-08-18 DIAGNOSIS — O358XX0 Maternal care for other (suspected) fetal abnormality and damage, not applicable or unspecified: Secondary | ICD-10-CM | POA: Insufficient documentation

## 2013-08-22 ENCOUNTER — Other Ambulatory Visit (HOSPITAL_COMMUNITY): Payer: Self-pay | Admitting: Maternal and Fetal Medicine

## 2013-08-22 ENCOUNTER — Ambulatory Visit (HOSPITAL_COMMUNITY)
Admission: RE | Admit: 2013-08-22 | Discharge: 2013-08-22 | Disposition: A | Discharge status: Home / Self Care | Payer: BC Managed Care – PPO | Source: Ambulatory Visit | Attending: Obstetrics and Gynecology | Admitting: Obstetrics and Gynecology

## 2013-08-22 DIAGNOSIS — O30009 Twin pregnancy, unspecified number of placenta and unspecified number of amniotic sacs, unspecified trimester: Secondary | ICD-10-CM | POA: Insufficient documentation

## 2013-08-22 DIAGNOSIS — O358XX0 Maternal care for other (suspected) fetal abnormality and damage, not applicable or unspecified: Secondary | ICD-10-CM | POA: Insufficient documentation

## 2013-08-22 DIAGNOSIS — IMO0002 Reserved for concepts with insufficient information to code with codable children: Secondary | ICD-10-CM

## 2013-08-22 DIAGNOSIS — O4100X0 Oligohydramnios, unspecified trimester, not applicable or unspecified: Secondary | ICD-10-CM | POA: Insufficient documentation

## 2013-08-22 NOTE — ED Notes (Signed)
Unable to trace NST. Tracing same FHT's for babies.  BPP ordered.

## 2013-08-25 ENCOUNTER — Ambulatory Visit (HOSPITAL_COMMUNITY)
Admission: RE | Admit: 2013-08-25 | Discharge: 2013-08-25 | Disposition: A | Discharge status: Home / Self Care | Payer: BC Managed Care – PPO | Source: Ambulatory Visit | Attending: Obstetrics and Gynecology | Admitting: Obstetrics and Gynecology

## 2013-08-25 ENCOUNTER — Other Ambulatory Visit (HOSPITAL_COMMUNITY): Payer: Self-pay | Admitting: Maternal and Fetal Medicine

## 2013-08-25 DIAGNOSIS — IMO0002 Reserved for concepts with insufficient information to code with codable children: Secondary | ICD-10-CM

## 2013-08-25 DIAGNOSIS — O30009 Twin pregnancy, unspecified number of placenta and unspecified number of amniotic sacs, unspecified trimester: Secondary | ICD-10-CM

## 2013-08-25 DIAGNOSIS — O4100X0 Oligohydramnios, unspecified trimester, not applicable or unspecified: Secondary | ICD-10-CM | POA: Insufficient documentation

## 2013-08-25 DIAGNOSIS — O358XX0 Maternal care for other (suspected) fetal abnormality and damage, not applicable or unspecified: Secondary | ICD-10-CM | POA: Insufficient documentation

## 2013-08-25 NOTE — Progress Notes (Signed)
Catherine Padilla was seen for ultrasound appointment today.  Please see [facility] report for details.

## 2013-08-29 ENCOUNTER — Ambulatory Visit (HOSPITAL_COMMUNITY)
Admission: RE | Admit: 2013-08-29 | Discharge: 2013-08-29 | Disposition: A | Discharge status: Home / Self Care | Payer: BC Managed Care – PPO | Source: Ambulatory Visit | Attending: Obstetrics and Gynecology | Admitting: Obstetrics and Gynecology

## 2013-08-29 ENCOUNTER — Other Ambulatory Visit (HOSPITAL_COMMUNITY): Payer: Self-pay | Admitting: Maternal and Fetal Medicine

## 2013-08-29 DIAGNOSIS — O30009 Twin pregnancy, unspecified number of placenta and unspecified number of amniotic sacs, unspecified trimester: Secondary | ICD-10-CM

## 2013-08-29 DIAGNOSIS — IMO0002 Reserved for concepts with insufficient information to code with codable children: Secondary | ICD-10-CM

## 2013-08-29 DIAGNOSIS — O4100X0 Oligohydramnios, unspecified trimester, not applicable or unspecified: Secondary | ICD-10-CM | POA: Insufficient documentation

## 2013-08-29 DIAGNOSIS — O358XX0 Maternal care for other (suspected) fetal abnormality and damage, not applicable or unspecified: Secondary | ICD-10-CM | POA: Insufficient documentation

## 2013-08-31 ENCOUNTER — Other Ambulatory Visit (HOSPITAL_COMMUNITY): Payer: Self-pay | Admitting: Maternal and Fetal Medicine

## 2013-08-31 DIAGNOSIS — O30009 Twin pregnancy, unspecified number of placenta and unspecified number of amniotic sacs, unspecified trimester: Secondary | ICD-10-CM

## 2013-08-31 DIAGNOSIS — IMO0002 Reserved for concepts with insufficient information to code with codable children: Secondary | ICD-10-CM

## 2013-09-01 ENCOUNTER — Ambulatory Visit (HOSPITAL_COMMUNITY)
Admission: RE | Admit: 2013-09-01 | Discharge: 2013-09-01 | Disposition: A | Discharge status: Home / Self Care | Payer: BC Managed Care – PPO | Source: Ambulatory Visit | Attending: Obstetrics and Gynecology | Admitting: Obstetrics and Gynecology

## 2013-09-01 DIAGNOSIS — O4100X0 Oligohydramnios, unspecified trimester, not applicable or unspecified: Secondary | ICD-10-CM | POA: Insufficient documentation

## 2013-09-01 DIAGNOSIS — O30009 Twin pregnancy, unspecified number of placenta and unspecified number of amniotic sacs, unspecified trimester: Secondary | ICD-10-CM | POA: Insufficient documentation

## 2013-09-01 DIAGNOSIS — O358XX0 Maternal care for other (suspected) fetal abnormality and damage, not applicable or unspecified: Secondary | ICD-10-CM | POA: Insufficient documentation

## 2013-09-01 DIAGNOSIS — IMO0002 Reserved for concepts with insufficient information to code with codable children: Secondary | ICD-10-CM

## 2013-09-01 MED ORDER — BETAMETHASONE SOD PHOS & ACET 6 (3-3) MG/ML IJ SUSP
12.0000 mg | Freq: Once | INTRAMUSCULAR | Status: AC
  Administered 2013-09-01: 12 mg via INTRAMUSCULAR
  Filled 2013-09-01: qty 2

## 2013-09-01 NOTE — ED Notes (Signed)
2nd course of BMZ started today.  Pt to return to MAU tomorrow for 2nd dose of this course.  Order signed and held.

## 2013-09-01 NOTE — Progress Notes (Signed)
GELISA TIEKEN  was seen today for an ultrasound appointment.  See full report in [facility].  Impression: MC/DA twin gestation at 47 2/7 weeks Twin B with fetal growth restriction - previously seen at [facility] - not felt to be consistent with TTTS Completed course of betamethasone at 24 weeks  A: maternal left, cephalic, posterior placenta      Fetal growth is appropriate (42nd %tile) 571 g weight gain over 3 weeks      Normal amniotic fluid volume      BPP 8/8      Normal UA Dopplers for gestational age      An isolated fluid filled loop of bowel is noted in the pelvis measuring approximatly 1 cm in diameter      Normal bladder visualized  B: maternal right, breech, posterior placenta      Velamentous placental cord insertion      Estimated fetal weight < 10th %tile (222 g weight gain over 3 weeks)      All biometric measurements < 5th %tile      Subjectively low normal amniotic fluid volume (max verticle pocket 2.8 cm)      Elevated UA Dopplers for gestaional age.  Single tracing with intermittent absent diastolic flow.  No reversed flow.      BPP 8/8      Normal Bladder visualized  Recommendations: Will give repeat course of betamethasone. Continue 2x weekly BPPs and UA Doppler studies. Follow up growth in 2-3 weeks.  Would move toward delivery for BPP < 6, reversed diastolic flow on UA Dopplers or poor interval fetal growth. If testing otherwise remains stable, recommend delivery at [redacted] weeks gestation.  Alpha Gula, MD

## 2013-09-02 ENCOUNTER — Inpatient Hospital Stay (HOSPITAL_COMMUNITY)
Admission: AD | Admit: 2013-09-02 | Discharge: 2013-09-02 | Disposition: A | Discharge status: Home / Self Care | Payer: BC Managed Care – PPO | Source: Ambulatory Visit | Attending: Obstetrics and Gynecology | Admitting: Obstetrics and Gynecology

## 2013-09-02 DIAGNOSIS — O4700 False labor before 37 completed weeks of gestation, unspecified trimester: Secondary | ICD-10-CM | POA: Insufficient documentation

## 2013-09-02 MED ORDER — BETAMETHASONE SOD PHOS & ACET 6 (3-3) MG/ML IJ SUSP
12.0000 mg | Freq: Once | INTRAMUSCULAR | Status: AC
  Administered 2013-09-02: 12 mg via INTRAMUSCULAR
  Filled 2013-09-02: qty 2

## 2013-09-02 NOTE — MAU Note (Signed)
Injection only, no pain or bleeding

## 2013-09-04 ENCOUNTER — Other Ambulatory Visit (HOSPITAL_COMMUNITY): Payer: Self-pay | Admitting: Maternal and Fetal Medicine

## 2013-09-04 DIAGNOSIS — O30009 Twin pregnancy, unspecified number of placenta and unspecified number of amniotic sacs, unspecified trimester: Secondary | ICD-10-CM

## 2013-09-04 DIAGNOSIS — IMO0002 Reserved for concepts with insufficient information to code with codable children: Secondary | ICD-10-CM

## 2013-09-05 ENCOUNTER — Encounter (HOSPITAL_COMMUNITY): Payer: Self-pay

## 2013-09-05 ENCOUNTER — Other Ambulatory Visit (HOSPITAL_COMMUNITY): Payer: Self-pay | Admitting: Maternal and Fetal Medicine

## 2013-09-05 ENCOUNTER — Ambulatory Visit (HOSPITAL_COMMUNITY)
Admission: RE | Admit: 2013-09-05 | Discharge: 2013-09-05 | Disposition: A | Discharge status: Home / Self Care | Payer: BC Managed Care – PPO | Source: Ambulatory Visit | Attending: Obstetrics and Gynecology | Admitting: Obstetrics and Gynecology

## 2013-09-05 DIAGNOSIS — IMO0002 Reserved for concepts with insufficient information to code with codable children: Secondary | ICD-10-CM

## 2013-09-05 DIAGNOSIS — O30009 Twin pregnancy, unspecified number of placenta and unspecified number of amniotic sacs, unspecified trimester: Secondary | ICD-10-CM

## 2013-09-05 DIAGNOSIS — O358XX0 Maternal care for other (suspected) fetal abnormality and damage, not applicable or unspecified: Secondary | ICD-10-CM | POA: Insufficient documentation

## 2013-09-05 DIAGNOSIS — O4100X0 Oligohydramnios, unspecified trimester, not applicable or unspecified: Secondary | ICD-10-CM | POA: Insufficient documentation

## 2013-09-07 ENCOUNTER — Other Ambulatory Visit (HOSPITAL_COMMUNITY): Payer: Self-pay | Admitting: Maternal and Fetal Medicine

## 2013-09-07 DIAGNOSIS — O30009 Twin pregnancy, unspecified number of placenta and unspecified number of amniotic sacs, unspecified trimester: Secondary | ICD-10-CM

## 2013-09-07 DIAGNOSIS — IMO0001 Reserved for inherently not codable concepts without codable children: Secondary | ICD-10-CM

## 2013-09-07 DIAGNOSIS — IMO0002 Reserved for concepts with insufficient information to code with codable children: Secondary | ICD-10-CM

## 2013-09-08 ENCOUNTER — Ambulatory Visit (HOSPITAL_COMMUNITY)
Admission: RE | Admit: 2013-09-08 | Discharge: 2013-09-08 | Disposition: A | Discharge status: Home / Self Care | Payer: BC Managed Care – PPO | Source: Ambulatory Visit | Attending: Obstetrics and Gynecology | Admitting: Obstetrics and Gynecology

## 2013-09-08 ENCOUNTER — Inpatient Hospital Stay (HOSPITAL_COMMUNITY)
Admission: AD | Admit: 2013-09-08 | Discharge: 2013-09-12 | DRG: 765 | Disposition: A | Discharge status: Home / Self Care | Payer: BC Managed Care – PPO | Source: Ambulatory Visit | Attending: Obstetrics and Gynecology | Admitting: Obstetrics and Gynecology

## 2013-09-08 ENCOUNTER — Encounter (HOSPITAL_COMMUNITY)
Admission: AD | Disposition: A | Discharge status: Home / Self Care | Payer: Self-pay | Source: Ambulatory Visit | Attending: Obstetrics and Gynecology

## 2013-09-08 ENCOUNTER — Encounter (HOSPITAL_COMMUNITY): Payer: Self-pay

## 2013-09-08 ENCOUNTER — Inpatient Hospital Stay (HOSPITAL_COMMUNITY): Payer: BC Managed Care – PPO | Admitting: Anesthesiology

## 2013-09-08 ENCOUNTER — Encounter (HOSPITAL_COMMUNITY): Payer: Self-pay | Admitting: *Deleted

## 2013-09-08 ENCOUNTER — Encounter (HOSPITAL_COMMUNITY): Payer: BC Managed Care – PPO | Admitting: Anesthesiology

## 2013-09-08 DIAGNOSIS — O360990 Maternal care for other rhesus isoimmunization, unspecified trimester, not applicable or unspecified: Secondary | ICD-10-CM | POA: Diagnosis present

## 2013-09-08 DIAGNOSIS — IMO0002 Reserved for concepts with insufficient information to code with codable children: Secondary | ICD-10-CM

## 2013-09-08 DIAGNOSIS — O3090 Multiple gestation, unspecified, unspecified trimester: Principal | ICD-10-CM | POA: Diagnosis present

## 2013-09-08 DIAGNOSIS — O358XX0 Maternal care for other (suspected) fetal abnormality and damage, not applicable or unspecified: Secondary | ICD-10-CM | POA: Diagnosis present

## 2013-09-08 DIAGNOSIS — O365990 Maternal care for other known or suspected poor fetal growth, unspecified trimester, not applicable or unspecified: Secondary | ICD-10-CM | POA: Diagnosis present

## 2013-09-08 DIAGNOSIS — O30039 Twin pregnancy, monochorionic/diamniotic, unspecified trimester: Secondary | ICD-10-CM

## 2013-09-08 DIAGNOSIS — IMO0001 Reserved for inherently not codable concepts without codable children: Secondary | ICD-10-CM

## 2013-09-08 DIAGNOSIS — O30009 Twin pregnancy, unspecified number of placenta and unspecified number of amniotic sacs, unspecified trimester: Secondary | ICD-10-CM

## 2013-09-08 LAB — CBC
HCT: 38.4 % (ref 36.0–46.0)
Hemoglobin: 13.2 g/dL (ref 12.0–15.0)
MCH: 30.4 pg (ref 26.0–34.0)
MCHC: 34.4 g/dL (ref 30.0–36.0)
MCV: 88.5 fL (ref 78.0–100.0)
RDW: 12.9 % (ref 11.5–15.5)

## 2013-09-08 LAB — RPR: RPR Ser Ql: NONREACTIVE

## 2013-09-08 SURGERY — Surgical Case
Anesthesia: Spinal | Site: Abdomen | Wound class: Clean Contaminated

## 2013-09-08 MED ORDER — HYDROMORPHONE HCL PF 1 MG/ML IJ SOLN: 0.2500 mg | INTRAMUSCULAR | Status: DC | PRN

## 2013-09-08 MED ORDER — DIPHENHYDRAMINE HCL 50 MG/ML IJ SOLN: 12.5000 mg | INTRAMUSCULAR | Status: DC | PRN

## 2013-09-08 MED ORDER — CITRIC ACID-SODIUM CITRATE 334-500 MG/5ML PO SOLN
ORAL | Status: AC
  Filled 2013-09-08: qty 15

## 2013-09-08 MED ORDER — CEFAZOLIN SODIUM-DEXTROSE 2-3 GM-% IV SOLR
2.0000 g | INTRAVENOUS | Status: DC
  Filled 2013-09-08: qty 50

## 2013-09-08 MED ORDER — SENNOSIDES-DOCUSATE SODIUM 8.6-50 MG PO TABS
2.0000 | ORAL_TABLET | ORAL | Status: DC
  Administered 2013-09-09 – 2013-09-11 (×4): 2 via ORAL
  Filled 2013-09-08 (×4): qty 2

## 2013-09-08 MED ORDER — ONDANSETRON HCL 4 MG/2ML IJ SOLN: 4.0000 mg | Freq: Three times a day (TID) | INTRAMUSCULAR | Status: DC | PRN

## 2013-09-08 MED ORDER — OXYTOCIN 10 UNIT/ML IJ SOLN
40.0000 [IU] | INTRAVENOUS | Status: DC | PRN
  Administered 2013-09-08: 40 [IU] via INTRAVENOUS

## 2013-09-08 MED ORDER — NALBUPHINE HCL 10 MG/ML IJ SOLN
5.0000 mg | INTRAMUSCULAR | Status: DC | PRN
  Filled 2013-09-08: qty 1

## 2013-09-08 MED ORDER — ZOLPIDEM TARTRATE 5 MG PO TABS: 5.0000 mg | ORAL_TABLET | Freq: Every evening | ORAL | Status: DC | PRN

## 2013-09-08 MED ORDER — SCOPOLAMINE 1 MG/3DAYS TD PT72
MEDICATED_PATCH | TRANSDERMAL | Status: AC
  Administered 2013-09-08: 1.5 mg via TRANSDERMAL
  Filled 2013-09-08: qty 1

## 2013-09-08 MED ORDER — MEPERIDINE HCL 25 MG/ML IJ SOLN
INTRAMUSCULAR | Status: AC
  Administered 2013-09-08: 6.25 mg via INTRAVENOUS
  Filled 2013-09-08: qty 1

## 2013-09-08 MED ORDER — KETOROLAC TROMETHAMINE 60 MG/2ML IM SOLN
INTRAMUSCULAR | Status: AC
  Administered 2013-09-08: 60 mg via INTRAMUSCULAR
  Filled 2013-09-08: qty 2

## 2013-09-08 MED ORDER — LACTATED RINGERS IV SOLN
INTRAVENOUS | Status: DC
  Administered 2013-09-08: 11:00:00 via INTRAVENOUS

## 2013-09-08 MED ORDER — MEPERIDINE HCL 25 MG/ML IJ SOLN
INTRAMUSCULAR | Status: DC | PRN
  Administered 2013-09-08: 12.5 mg via INTRAVENOUS

## 2013-09-08 MED ORDER — LANOLIN HYDROUS EX OINT: 1.0000 "application " | TOPICAL_OINTMENT | CUTANEOUS | Status: DC | PRN

## 2013-09-08 MED ORDER — INFLUENZA VAC SPLIT QUAD 0.5 ML IM SUSP
0.5000 mL | INTRAMUSCULAR | Status: AC
  Administered 2013-09-09: 0.5 mL via INTRAMUSCULAR
  Filled 2013-09-08: qty 0.5

## 2013-09-08 MED ORDER — METOCLOPRAMIDE HCL 5 MG/ML IJ SOLN: 10.0000 mg | Freq: Three times a day (TID) | INTRAMUSCULAR | Status: DC | PRN

## 2013-09-08 MED ORDER — WITCH HAZEL-GLYCERIN EX PADS: 1.0000 "application " | MEDICATED_PAD | CUTANEOUS | Status: DC | PRN

## 2013-09-08 MED ORDER — SIMETHICONE 80 MG PO CHEW
80.0000 mg | CHEWABLE_TABLET | ORAL | Status: DC | PRN
  Filled 2013-09-08: qty 1

## 2013-09-08 MED ORDER — OXYTOCIN 40 UNITS IN LACTATED RINGERS INFUSION - SIMPLE MED: 62.5000 mL/h | INTRAVENOUS | Status: AC

## 2013-09-08 MED ORDER — TETANUS-DIPHTH-ACELL PERTUSSIS 5-2.5-18.5 LF-MCG/0.5 IM SUSP: 0.5000 mL | Freq: Once | INTRAMUSCULAR | Status: DC

## 2013-09-08 MED ORDER — PRENATAL MULTIVITAMIN CH
1.0000 | ORAL_TABLET | Freq: Every day | ORAL | Status: DC
  Administered 2013-09-09 – 2013-09-12 (×4): 1 via ORAL
  Filled 2013-09-08 (×3): qty 1

## 2013-09-08 MED ORDER — NALOXONE HCL 1 MG/ML IJ SOLN
1.0000 ug/kg/h | INTRAVENOUS | Status: DC | PRN
  Filled 2013-09-08: qty 2

## 2013-09-08 MED ORDER — ONDANSETRON HCL 4 MG/2ML IJ SOLN: 4.0000 mg | INTRAMUSCULAR | Status: DC | PRN

## 2013-09-08 MED ORDER — DIPHENHYDRAMINE HCL 50 MG/ML IJ SOLN: 25.0000 mg | INTRAMUSCULAR | Status: DC | PRN

## 2013-09-08 MED ORDER — IBUPROFEN 600 MG PO TABS
600.0000 mg | ORAL_TABLET | Freq: Four times a day (QID) | ORAL | Status: DC
  Administered 2013-09-09 – 2013-09-12 (×15): 600 mg via ORAL
  Filled 2013-09-08 (×15): qty 1

## 2013-09-08 MED ORDER — ONDANSETRON HCL 4 MG PO TABS: 4.0000 mg | ORAL_TABLET | ORAL | Status: DC | PRN

## 2013-09-08 MED ORDER — SODIUM CHLORIDE 0.9 % IJ SOLN: 3.0000 mL | INTRAMUSCULAR | Status: DC | PRN

## 2013-09-08 MED ORDER — NALOXONE HCL 0.4 MG/ML IJ SOLN: 0.4000 mg | INTRAMUSCULAR | Status: DC | PRN

## 2013-09-08 MED ORDER — MEPERIDINE HCL 25 MG/ML IJ SOLN
6.2500 mg | INTRAMUSCULAR | Status: AC | PRN
  Administered 2013-09-08 (×2): 6.25 mg via INTRAVENOUS

## 2013-09-08 MED ORDER — LACTATED RINGERS IV SOLN: INTRAVENOUS | Status: DC

## 2013-09-08 MED ORDER — CITRIC ACID-SODIUM CITRATE 334-500 MG/5ML PO SOLN
30.0000 mL | Freq: Once | ORAL | Status: AC
  Administered 2013-09-08: 30 mL via ORAL

## 2013-09-08 MED ORDER — FENTANYL CITRATE 0.05 MG/ML IJ SOLN
INTRAMUSCULAR | Status: DC | PRN
  Administered 2013-09-08: 25 ug via INTRATHECAL

## 2013-09-08 MED ORDER — SIMETHICONE 80 MG PO CHEW
80.0000 mg | CHEWABLE_TABLET | ORAL | Status: DC
  Administered 2013-09-11: 80 mg via ORAL
  Filled 2013-09-08 (×4): qty 1

## 2013-09-08 MED ORDER — CEFAZOLIN SODIUM-DEXTROSE 2-3 GM-% IV SOLR
INTRAVENOUS | Status: DC | PRN
  Administered 2013-09-08: 2 g via INTRAVENOUS

## 2013-09-08 MED ORDER — ONDANSETRON HCL 4 MG/2ML IJ SOLN
INTRAMUSCULAR | Status: DC | PRN
  Administered 2013-09-08: 4 mg via INTRAMUSCULAR

## 2013-09-08 MED ORDER — OXYCODONE-ACETAMINOPHEN 5-325 MG PO TABS: 1.0000 | ORAL_TABLET | ORAL | Status: DC | PRN

## 2013-09-08 MED ORDER — DIPHENHYDRAMINE HCL 25 MG PO CAPS: 25.0000 mg | ORAL_CAPSULE | Freq: Four times a day (QID) | ORAL | Status: DC | PRN

## 2013-09-08 MED ORDER — LACTATED RINGERS IV SOLN
INTRAVENOUS | Status: DC
  Administered 2013-09-08: 19:00:00 via INTRAVENOUS

## 2013-09-08 MED ORDER — PHENYLEPHRINE HCL 10 MG/ML IJ SOLN
INTRAMUSCULAR | Status: DC | PRN
  Administered 2013-09-08: 40 ug via INTRAVENOUS
  Administered 2013-09-08: 80 ug via INTRAVENOUS

## 2013-09-08 MED ORDER — CITRIC ACID-SODIUM CITRATE 334-500 MG/5ML PO SOLN
ORAL | Status: AC
  Administered 2013-09-08: 30 mL
  Filled 2013-09-08: qty 15

## 2013-09-08 MED ORDER — KETOROLAC TROMETHAMINE 60 MG/2ML IM SOLN
60.0000 mg | Freq: Once | INTRAMUSCULAR | Status: AC | PRN
  Administered 2013-09-08: 60 mg via INTRAMUSCULAR

## 2013-09-08 MED ORDER — PHENYLEPHRINE HCL 10 MG/ML IJ SOLN
20000.0000 ug | INTRAVENOUS | Status: DC | PRN
  Administered 2013-09-08: 60 ug/min via INTRAVENOUS

## 2013-09-08 MED ORDER — MENTHOL 3 MG MT LOZG: 1.0000 | LOZENGE | OROMUCOSAL | Status: DC | PRN

## 2013-09-08 MED ORDER — DIPHENHYDRAMINE HCL 25 MG PO CAPS: 25.0000 mg | ORAL_CAPSULE | ORAL | Status: DC | PRN

## 2013-09-08 MED ORDER — KETOROLAC TROMETHAMINE 30 MG/ML IJ SOLN: 30.0000 mg | Freq: Four times a day (QID) | INTRAMUSCULAR | Status: AC | PRN

## 2013-09-08 MED ORDER — CITRIC ACID-SODIUM CITRATE 334-500 MG/5ML PO SOLN: 30.0000 mL | Freq: Once | ORAL | Status: DC

## 2013-09-08 MED ORDER — SIMETHICONE 80 MG PO CHEW
80.0000 mg | CHEWABLE_TABLET | Freq: Three times a day (TID) | ORAL | Status: DC
  Administered 2013-09-09 – 2013-09-12 (×12): 80 mg via ORAL
  Filled 2013-09-08 (×10): qty 1

## 2013-09-08 MED ORDER — MORPHINE SULFATE (PF) 0.5 MG/ML IJ SOLN
INTRAMUSCULAR | Status: DC | PRN
  Administered 2013-09-08: .15 mg via INTRATHECAL

## 2013-09-08 MED ORDER — SCOPOLAMINE 1 MG/3DAYS TD PT72
1.0000 | MEDICATED_PATCH | Freq: Once | TRANSDERMAL | Status: AC
  Administered 2013-09-08: 1.5 mg via TRANSDERMAL

## 2013-09-08 MED ORDER — LACTATED RINGERS IV SOLN
INTRAVENOUS | Status: DC | PRN
  Administered 2013-09-08 (×3): via INTRAVENOUS

## 2013-09-08 MED ORDER — DIBUCAINE 1 % RE OINT
1.0000 "application " | TOPICAL_OINTMENT | RECTAL | Status: DC | PRN
  Filled 2013-09-08: qty 28

## 2013-09-08 SURGICAL SUPPLY — 25 items
CLAMP CORD UMBIL (MISCELLANEOUS) ×4 IMPLANT
CLOTH BEACON ORANGE TIMEOUT ST (SAFETY) ×2 IMPLANT
DERMABOND ADVANCED (GAUZE/BANDAGES/DRESSINGS) ×1
DERMABOND ADVANCED .7 DNX12 (GAUZE/BANDAGES/DRESSINGS) ×1 IMPLANT
DRAPE LG THREE QUARTER DISP (DRAPES) ×4 IMPLANT
DRSG OPSITE POSTOP 4X10 (GAUZE/BANDAGES/DRESSINGS) ×2 IMPLANT
DURAPREP 26ML APPLICATOR (WOUND CARE) ×2 IMPLANT
ELECT REM PT RETURN 9FT ADLT (ELECTROSURGICAL) ×2
ELECTRODE REM PT RTRN 9FT ADLT (ELECTROSURGICAL) ×1 IMPLANT
GLOVE BIO SURGEON STRL SZ7 (GLOVE) ×2 IMPLANT
GOWN STRL REIN XL XLG (GOWN DISPOSABLE) ×4 IMPLANT
KIT ABG SYR 3ML LUER SLIP (SYRINGE) ×4 IMPLANT
NEEDLE HYPO 25X5/8 SAFETYGLIDE (NEEDLE) ×4 IMPLANT
NS IRRIG 1000ML POUR BTL (IV SOLUTION) ×2 IMPLANT
PACK C SECTION WH (CUSTOM PROCEDURE TRAY) ×2 IMPLANT
PAD OB MATERNITY 4.3X12.25 (PERSONAL CARE ITEMS) ×2 IMPLANT
RTRCTR C-SECT PINK 25CM LRG (MISCELLANEOUS) ×2 IMPLANT
SUT CHROMIC 1 CTX 36 (SUTURE) ×6 IMPLANT
SUT PDS AB 0 CTX 60 (SUTURE) ×2 IMPLANT
SUT VIC AB 2-0 CT1 27 (SUTURE) ×1
SUT VIC AB 2-0 CT1 TAPERPNT 27 (SUTURE) ×1 IMPLANT
SUT VIC AB 4-0 KS 27 (SUTURE) ×2 IMPLANT
TOWEL OR 17X24 6PK STRL BLUE (TOWEL DISPOSABLE) ×2 IMPLANT
TRAY FOLEY CATH 14FR (SET/KITS/TRAYS/PACK) ×2 IMPLANT
WATER STERILE IRR 1000ML POUR (IV SOLUTION) ×2 IMPLANT

## 2013-09-08 NOTE — Anesthesia Procedure Notes (Signed)

## 2013-09-08 NOTE — ED Notes (Signed)
Patient states feeling worse today. Has cold and is taking Sudafed to relieve symptoms.

## 2013-09-08 NOTE — H&P (Signed)
Catherine Padilla is a 27 y.o. female presenting for cesarean section  27 yo G1P0 A@ 31+2 with mono/di twins presents from MFM for primary cesarean section. Patients pregnancy has been complicated by growth restriction of baby B. She's been followed with 2x/week fetal dopplers. Today in MFM dopplers on baby B showed persistent reversed EDF and a pericardial infusion. Patient has had BMZ x 2.  1) Mono/Di twin pregnancy 2) Growth discordance dx @ 17+5 with echogenic bowel of baby B. Being followed by MFM & MFM @ [facility] for possible hybrid presentation of TTTS.  3) Echogenic bowel baby B: [facility] NIPT low risk. TORCH titers negative, CF negative 4) Rh negative: Rhogam @ 28 wks 5) HEG: Resolving, on Diclegis 2 po QHS, zofran Qam. Amnormal LFTs, resolving 6) Abnormal LFTs: improving, Hepatitis serologies negative History OB History   Grav Para Term Preterm Abortions TAB SAB Ect Mult Living   1         0     No past medical history on file. Past Surgical History  Procedure Laterality Date  . No past surgeries     Family History: family history includes Diabetes in her paternal grandmother; Hypertension in her maternal grandmother and mother. Social History:  reports that she has never smoked. She does not have any smokeless tobacco history on file. She reports that she does not drink alcohol or use illicit drugs.   Prenatal Transfer Tool  Maternal Diabetes: No Genetic Screening: Normal Maternal Ultrasounds/Referrals: Abnormal:  Findings:   IUGR Fetal Ultrasounds or other Referrals:  Fetal echo Maternal Substance Abuse:  No Significant Maternal Medications:  None Significant Maternal Lab Results:  None Other Comments:  None  ROS: as above    Last menstrual period 02/01/2013. Exam Physical Exam  Prenatal labs: ABO, Rh:  O neg Antibody:  Neg Rubella:  immune RPR:   NR HBsAg:   Neg HIV:   NR GBS:   not done  Assessment/Plan: 1) Admit 2) Continuous monitoring until ready for OR 3)  Cesarean delivery today 4) NICU team has been notified   [name] 09/08/2013, 10:23 AM

## 2013-09-08 NOTE — Transfer of Care (Signed)
Immediate Anesthesia Transfer of Care Note  Patient: Catherine Padilla  Procedure(s) Performed: Procedure(s): Primary CESAREAN SECTION/Twins (N/A)  Patient Location: PACU  Anesthesia Type:Spinal  Level of Consciousness: awake, alert  and oriented  Airway & Oxygen Therapy: Patient Spontanous Breathing  Post-op Assessment: Report given to PACU RN and Post -op Vital signs reviewed and stable  Post vital signs: stable  Complications: No apparent anesthesia complications

## 2013-09-08 NOTE — Progress Notes (Signed)
Awaiting lab results before taking patient to OR.  Dr Jean Rosenthal notified that sodium citrate was given at 1047. Plan is to repeat Sodium citrate just before patient goes to OR.  Order received.

## 2013-09-08 NOTE — Anesthesia Postprocedure Evaluation (Signed)
  Anesthesia Post-op Note  Patient: Catherine Padilla  Procedure(s) Performed: Procedure(s): Primary CESAREAN SECTION/Twins (N/A)  Patient is awake, responsive, moving her legs, and has signs of resolution of her numbness. Pain and nausea are reasonably well controlled. Vital signs are stable and clinically acceptable. Oxygen saturation is clinically acceptable. There are no apparent anesthetic complications at this time. Patient is ready for discharge.

## 2013-09-08 NOTE — Anesthesia Preprocedure Evaluation (Addendum)
Anesthesia Evaluation  Patient identified by MRN, date of birth, ID band Patient awake    Reviewed: Allergy & Precautions, H&P , Patient's Chart, lab work & pertinent test results  Airway Mallampati: II TM Distance: >3 FB Neck ROM: full    Dental no notable dental hx.    Pulmonary  breath sounds clear to auscultation  Pulmonary exam normal       Cardiovascular Exercise Tolerance: Good Rhythm:regular Rate:Normal     Neuro/Psych    GI/Hepatic   Endo/Other    Renal/GU      Musculoskeletal   Abdominal   Peds  Hematology   Anesthesia Other Findings   Reproductive/Obstetrics                           Anesthesia Physical Anesthesia Plan  ASA: II and emergent  Anesthesia Plan: Spinal   Post-op Pain Management:    Induction:   Airway Management Planned: Natural Airway  Additional Equipment:   Intra-op Plan:   Post-operative Plan:   Informed Consent: I have reviewed the patients History and Physical, chart, labs and discussed the procedure including the risks, benefits and alternatives for the proposed anesthesia with the patient or authorized representative who has indicated his/her understanding and acceptance.   Dental advisory given  Plan Discussed with: CRNA and Surgeon  Anesthesia Plan Comments: ( Discussed Risk of Ha/Ba/ nerve injury, failed block, etc.)       Anesthesia Quick Evaluation

## 2013-09-08 NOTE — Op Note (Signed)
Pre-Operative Diagnosis: 1) 31+2 week Monochorionic Diamniotic twin gestation 2) Fetal growth restriction of baby B with reversed end diastolic flow and velamentous cord insertion Postoperative Diagnosis: Same Procedure: Primary low transverse cesarean section Surgeon: Dr. Waynard Reeds Assistant: None Operative Findings: Vigorous female infant infants. Baby A in the vertex presentation with apgars of 8 & 8, Baby B in complete breech presentation with apgars of 8 & 9. Placenta with velamentous cord insertion of baby B. Specimen: Placenta to pathology EBL: 700cc  Procedure:Ms. Catherine Padilla is an 27 year old gravida 1 para 0 at 50 weeks and 2 days estimated gestational age who presents for cesarean section. The patient is pregnant with mono-di twins. Her pregnancy has been complicated by growth restriction of baby B. She has been followed closely by MFM. Today reversed end diastolic flow within the umbilical cord of baby B was noted as well as a pericardial effusion. Given these findings MFM recommended immediate delivery Following the appropriate informed consent the patient was brought to the operating room where spinal anesthesia was administered and found to be adequate. She was placed in the dorsal supine position with a leftward tilt. She was prepped and draped in the normal sterile fashion. Scalpel was then used to make a Pfannenstiel skin incision which was carried down to the underlying layers of soft tissue to the fascia. The fascia was incised in the midline and the fascial incision was extended laterally with Mayo scissors. The superior aspect of the fascial incision was grasped with Coker clamps x2, tented up and the rectus muscles dissected off sharply with the electrocautery unit area and the same procedure was repeated on the inferior aspect of the fascial incision. The rectus muscles were separated in the midline. The abdominal peritoneum was identified, tented up, entered sharply, and the incision was  extended superiorly and inferiorly with good visualization of the bladder. The Alexis retractor was then deployed. The vesicouterine peritoneum was identified, tented up, entered sharply, and the bladder flap was created digitally. Scalpel was then used to make a low transverse incision on the uterus which was extended laterally with blunt dissection . The fetal vertex was identified, delivered easily through the uterine incision followed by the body. The infant cried vigorously, cord was clamped and cut and the infant was passed to the waiting neonatologist. The amniotic sac for baby B was ruptured and the fetal buttocks were grasped and delivered through the uterine incision. The cord was clamped and cut and the infant cried vigorously and was passed to the 2nd NICU team. The placenta was then delivered spontaneously, the uterus was cleared of all clot and debris. The uterine incision was repaired with #1 chromic in running locked fashion followed by a second imbricating layer. The Alexis retractor was removed. The uterus was returned to the abdominal cavity the abdominal cavity was cleared of all clot and debris. The abdominal peritoneum was reapproximated with 2-0 Vicryl in a running fashion, the rectus muscles was reapproximated with #1 chromic in a running fashion. The fascia was closed with a looped PDS in a running fashion. The subcutaneous tissue was reapproximated with 2-0 plain gut with interrupted stitches. The skin was closed with 4-0 vicryl in a subcuticular fashion and Dermabond.  All sponge lap and needle counts were correct x2. Patient tolerated the procedure well and recovered in stable condition following the procedure.

## 2013-09-09 LAB — CBC
HCT: 31.9 % — ABNORMAL LOW (ref 36.0–46.0)
Hemoglobin: 11 g/dL — ABNORMAL LOW (ref 12.0–15.0)
MCHC: 34.5 g/dL (ref 30.0–36.0)
Platelets: 158 10*3/uL (ref 150–400)
RBC: 3.59 MIL/uL — ABNORMAL LOW (ref 3.87–5.11)
WBC: 13.1 10*3/uL — ABNORMAL HIGH (ref 4.0–10.5)

## 2013-09-09 MED ORDER — RHO D IMMUNE GLOBULIN 1500 UNIT/2ML IJ SOLN
300.0000 ug | Freq: Once | INTRAMUSCULAR | Status: AC
  Administered 2013-09-09: 300 ug via INTRAMUSCULAR
  Filled 2013-09-09: qty 2

## 2013-09-09 MED ORDER — HYDROCODONE-ACETAMINOPHEN 5-325 MG PO TABS
1.0000 | ORAL_TABLET | ORAL | Status: DC | PRN
  Administered 2013-09-09 – 2013-09-11 (×6): 1 via ORAL
  Filled 2013-09-09 (×6): qty 1

## 2013-09-09 NOTE — Progress Notes (Signed)
Subjective: Postpartum Day 1: Cesarean Delivery Patient reports incisional pain and tolerating PO.    Objective: Vital signs in last 24 hours: Temp:  [97.8 F (36.6 C)-99.3 F (37.4 C)] 97.8 F (36.6 C) (10/18 0542) Pulse Rate:  [57-99] 59 (10/18 0542) Resp:  [16-21] 18 (10/18 0542) BP: (94-175)/(56-161) 116/77 mmHg (10/18 0542) SpO2:  [95 %-100 %] 96 % (10/18 0542)  Physical Exam:  General: alert, cooperative and appears stated age Lochia: appropriate Uterine Fundus: firm Incision: healing well DVT Evaluation: No evidence of DVT seen on physical exam.   Recent Labs  09/08/13 1110 09/09/13 0500  HGB 13.2 11.0*  HCT 38.4 31.9*    Assessment/Plan: Status post Cesarean section. Doing well postoperatively.  Continue current care. Babies in NICU. Baby A required intubation, Baby B on [place] s/p transfusion.  Bentzion Dauria H. 09/09/2013, 11:40 AM

## 2013-09-10 LAB — RH IG WORKUP (INCLUDES ABO/RH)
ABO/RH(D): O NEG
Fetal Screen: NEGATIVE
Gestational Age(Wks): 31
Unit division: 0

## 2013-09-10 MED ORDER — SALINE SPRAY 0.65 % NA SOLN
1.0000 | NASAL | Status: DC | PRN
  Administered 2013-09-10: 1 via NASAL
  Filled 2013-09-10: qty 44

## 2013-09-10 NOTE — Lactation Note (Addendum)
This note was copied from the chart of BoyA Danyle Boening. Lactation Consultation Note    Initial consult of this mom of twins was done yesterday, in NICU, born yesterday at 53 2/[redacted] weeks gestation. Mom has been pumping and I showed her hand expression, and she was able to express a few drops of colsotrum. Mom has the paper work for a pump rental. NICU EBM teaching done with mom. Dad very involved and helping mom with part care. I will follow this family in the NICU.         Today, mom is beginning to transition into mature milk. She is still only getting large drops . Mom encouraged to keep puming and be patient, that her milk will eventually come in. Advised to stay hydrated. Mom knows to call for questions/concerns.  Patient Name: Catherine Padilla Today's Date: 09/10/2013 Reason for consult: Initial assessment;NICU baby;Multiple gestation   Maternal Data Formula Feeding for Exclusion: Yes (babies in NICU - 31 2/[redacted] weeks gestation) Infant to breast within first hour of birth: No Breastfeeding delayed due to:: Infant status Has patient been taught Hand Expression?: Yes Does the patient have breastfeeding experience prior to this delivery?: No  Feeding    LATCH Score/Interventions                      Lactation Tools Discussed/Used Tools: Pump Breast pump type: Double-Electric Breast Pump WIC Program: No Pump Review: Setup, frequency, and cleaning;Milk Storage;Other (comment) (hand expression, NICU booklet on providing EBM ) Initiated by:: bedsie RN within 6 hours of delivery Date initiated:: 09/08/13   Consult Status Consult Status: Follow-up Date: 09/11/13 Follow-up type: In-patient    Alfred Levins 09/10/2013, 5:06 PM

## 2013-09-10 NOTE — Anesthesia Postprocedure Evaluation (Signed)
  Anesthesia Post-op Note  Patient: Catherine Padilla  Procedure(s) Performed: Procedure(s): Primary CESAREAN SECTION/Twins (N/A)  Patient Location: PACU and Mother/Baby  Anesthesia Type:Spinal  Level of Consciousness: awake, alert  and oriented  Airway and Oxygen Therapy: Patient Spontanous Breathing  Post-op Pain: mild  Post-op Assessment: Patient's Cardiovascular Status Stable, Respiratory Function Stable, No signs of Nausea or vomiting and Pain level controlled  Post-op Vital Signs: stable  Complications: No apparent anesthesia complications

## 2013-09-10 NOTE — Progress Notes (Signed)
Subjective: Postpartum Day 2: Cesarean Delivery Patient reports doing well, pain controlled  Objective: Vital signs in last 24 hours: Temp:  [97.6 F (36.4 C)-99.3 F (37.4 C)] 98.3 F (36.8 C) (10/19 0628) Pulse Rate:  [70-85] 81 (10/19 0628) Resp:  [16-18] 17 (10/19 0628) BP: (117-137)/(67-93) 120/69 mmHg (10/19 0628) SpO2:  [96 %-100 %] 96 % (10/19 1610)  Physical Exam:  General: alert, cooperative and appears stated age Lochia: appropriate Uterine Fundus: firm Incision: healing well DVT Evaluation: No evidence of DVT seen on physical exam.   Recent Labs  09/08/13 1110 09/09/13 0500  HGB 13.2 11.0*  HCT 38.4 31.9*    Assessment/Plan: Status post Cesarean section. Doing well postoperatively.  Continue current care. Babies remain in NICU, Baby A is intubated, Baby B on [facility]NC Sasha Rueth H. 09/10/2013, 10:10 AM

## 2013-09-11 ENCOUNTER — Encounter (HOSPITAL_COMMUNITY)
Admission: RE | Admit: 2013-09-11 | Discharge: 2013-09-11 | Disposition: A | Discharge status: Home / Self Care | Payer: BC Managed Care – PPO | Source: Ambulatory Visit

## 2013-09-11 ENCOUNTER — Encounter (HOSPITAL_COMMUNITY): Payer: Self-pay | Admitting: Obstetrics and Gynecology

## 2013-09-11 NOTE — Lactation Note (Signed)
This note was copied from the chart of BoyA Jessicah Croll. Lactation Consultation Note Mom states she is pumping and hand expressing; getting drops of colostrum. Discussed mom's questions about pumping, discussed pump rental.  Enc mom to call for assistance if she has any concerns.   Patient Name: Catherine Padilla [id number] Date: 09/11/2013     Maternal Data    Feeding    LATCH Score/Interventions                      Lactation Tools Discussed/Used     Consult Status      Lenard Forth 09/11/2013, 12:03 PM

## 2013-09-11 NOTE — Lactation Note (Signed)
This note was copied from the chart of Catherine Temia Dearcos. Lactation Consultation Note Pump rental paperwork completed; Rental pump provided; cash $30 received for down payment. Reviewed pump with mom and dad; questions answered.  Patient Name: Catherine Padilla Today's Date: 09/11/2013     Maternal Data    Feeding    LATCH Score/Interventions                      Lactation Tools Discussed/Used     Consult Status      Audriana Aldama Fulmer 09/11/2013, 1:29 PM    

## 2013-09-11 NOTE — Progress Notes (Signed)
Ur chart review completed.  

## 2013-09-11 NOTE — Progress Notes (Signed)
POD#3 Pt still with significant pain. Would like to delay discharge until tomorrow. Baby's still in NICU VSSAF IMP/ stable PLAN/ routine care.

## 2013-09-12 ENCOUNTER — Ambulatory Visit (HOSPITAL_COMMUNITY): Payer: BC Managed Care – PPO

## 2013-09-12 LAB — TYPE AND SCREEN
DAT, IgG: NEGATIVE
Unit division: 0
Unit division: 0

## 2013-09-12 MED ORDER — OXYCODONE-ACETAMINOPHEN 5-325 MG PO TABS: 1.0000 | ORAL_TABLET | ORAL | Status: AC | PRN

## 2013-09-12 MED ORDER — IBUPROFEN 600 MG PO TABS: 600.0000 mg | ORAL_TABLET | Freq: Four times a day (QID) | ORAL | Status: AC

## 2013-09-12 NOTE — Lactation Note (Signed)
This note was copied from the chart of Catherine Markee Finken. Lactation Consultation Note    Follow up consult with this mom of 31 6/[redacted]  Week gestation twins, now at 94 hours post partum. Mom being discharged to home today. She has rented a DEP, and her milk is transitioning in, so I advised mom to use standard pump setting, and pump 15-30 minutes, until she stops dripping. Mom pumped about 30-40 mls doing this. I also advised her to stay hydrated, and to add power pumping once a day. Mom has not held her babies yet, mostly because of fear. i spoke to Cathy Wyler, RN, the babyes' nurse, adn she also has been encouraging mom to hold the babies. Mom will do skin to skin today, at their next feeding times. I willfollow this family in the NICU  Patient Name: Catherine Padilla Today's Date: 09/12/2013 Reason for consult: Follow-up assessment;NICU baby;Multiple gestation   Maternal Data    Feeding Feeding Type: Formula Length of feed: 10 min  LATCH Score/Interventions                      Lactation Tools Discussed/Used Breast pump type: Double-Electric Breast Pump WIC Program: No Pump Review: Setup, frequency, and cleaning;Milk Storage (switch to standard setting, 15-30 minutes)   Consult Status Consult Status: PRN Follow-up type:  (in NICU)    Ruslan Mccabe Anne 09/12/2013, 10:34 AM    

## 2013-09-12 NOTE — Discharge Summary (Signed)
Obstetric Discharge Summary Reason for Admission: cesarean section Prenatal Procedures: NST and ultrasound Intrapartum Procedures: cesarean: low cervical, transverse Postpartum Procedures: none Complications-Operative and Postpartum: none Hemoglobin  Date Value Range Status  09/09/2013 11.0* 12.0 - 15.0 g/dL Final     HCT  Date Value Range Status  09/09/2013 31.9* 36.0 - 46.0 % Final    Physical Exam:  General: alert Lochia: appropriate Uterine Fundus: firm Incision: healing well DVT Evaluation: No evidence of DVT seen on physical exam.  Discharge Diagnoses: 31 week mono/di twin gestation.  Severe growth restriction of twin b with abnormal antepartum fetal assessment.  Discharge Information: Date: 09/12/2013 Activity: pelvic rest and no heavy activity Diet: routine Medications: PNV, Ibuprofen and Percocet Condition: stable Instructions: refer to practice specific booklet Discharge to: home Follow-up Information   Follow up with Almon Hercules., MD. Schedule an appointment as soon as possible for a visit in 4 weeks.   Specialty:  Obstetrics and Gynecology   Contact information:   [address] [place] [address] [place] [address] [phone]       Newborn Data:   Maeleigh, Buschman [[id number]]  Live born female  Birth Weight: 3 lb 5.6 oz (1520 g) APGAR: 8, 8   Bethanee, Redondo [[id number]]  Live born female  Birth Weight: 1 lb 8.7 oz (700 g) APGAR: 8, 9  Babies remain in NICU - both are stable at this time.  Mickel Baas 09/12/2013, 12:33 PM

## 2013-09-12 NOTE — Progress Notes (Signed)
Discharge teaching was done. Patient understood all instructions and did not have any questions. Patient ambulated out of the hospital with nurse and discharged home with husband.

## 2013-09-14 ENCOUNTER — Ambulatory Visit: Payer: Self-pay

## 2013-09-14 NOTE — Lactation Note (Signed)
This note was copied from the chart of Catherine Hollynn Belton. Lactation Consultation Note   Follow up consult with this mom of NICU twins, 32 1/7 weeks corrected gestation, and DOL 6. Mom has a low milk supply, pumping 15 mls every 3 hours, normal 20 - 60. She is pumping every 3 hours, 8 times a day, and doing hand expression after each pumping. I assisted mom with pumping, with lots of massage and hand expression, and still just 15 mls. Mom admits to needing to be better hydrated, and needing to do self care - at least one nap during the day.  I gave her information on moringa and fenugreek, power pumping. I increased mom to size 30 flanges, which are a better fit. Dr. Ross is calling in all-purpose-nipple-cream for mom, due to whie scab noted on her right nipple. I will follow this family in the NICU  Patient Name: Catherine Padilla Today's Date: 09/14/2013 Reason for consult: Follow-up assessment   Maternal Data    Feeding    LATCH Score/Interventions          Comfort (Breast/Nipple): Filling, red/small blisters or bruises, mild/mod discomfort  Problem noted: Cracked, bleeding, blisters, bruises (white scab in middle of right nipple =- all purpose nipple cream called in by Dr Ross RN)        Lactation Tools Discussed/Used Tools: Flanges Flange Size: 30 (increased to size 30 flanges with good fit)   Consult Status Consult Status: Follow-up Follow-up type:  (prn in NICU)    Gerhard Rappaport Anne 09/14/2013, 4:17 PM    

## 2013-09-15 ENCOUNTER — Ambulatory Visit (HOSPITAL_COMMUNITY): Payer: BC Managed Care – PPO

## 2013-09-19 ENCOUNTER — Other Ambulatory Visit (HOSPITAL_COMMUNITY): Payer: BC Managed Care – PPO

## 2013-09-19 ENCOUNTER — Ambulatory Visit (HOSPITAL_COMMUNITY): Payer: BC Managed Care – PPO

## 2013-09-20 ENCOUNTER — Ambulatory Visit: Payer: Self-pay

## 2013-09-20 NOTE — Lactation Note (Signed)
This note was copied from the chart of Catherine Ruthene Civil. Lactation Consultation Note Mom concerned about low supply, assisted mom in the NICU pumping room. Pump flanges very tight, provided size 36 flanges for mom, mom states much more comfortable. Discussed mom's questions regarding adding blessed thistle herbal supplement. Advised mom to continue pumping at least 8 times per day, followed by hand expression, power pumping, reduce stress, get some rest and stay hydrated. Will continue to follow.   Patient Name: Catherine Padilla Today's Date: 09/20/2013     Maternal Data    Feeding Feeding Type: Formula  LATCH Score/Interventions                      Lactation Tools Discussed/Used     Consult Status      [name] 09/20/2013, 2:34 PM    

## 2013-09-22 ENCOUNTER — Ambulatory Visit (HOSPITAL_COMMUNITY): Payer: BC Managed Care – PPO

## 2013-09-22 ENCOUNTER — Other Ambulatory Visit (HOSPITAL_COMMUNITY): Payer: BC Managed Care – PPO

## 2013-09-26 ENCOUNTER — Other Ambulatory Visit (HOSPITAL_COMMUNITY): Payer: BC Managed Care – PPO

## 2013-09-26 ENCOUNTER — Ambulatory Visit (HOSPITAL_COMMUNITY): Payer: BC Managed Care – PPO

## 2013-09-29 ENCOUNTER — Other Ambulatory Visit (HOSPITAL_COMMUNITY): Payer: BC Managed Care – PPO

## 2013-09-29 ENCOUNTER — Ambulatory Visit (HOSPITAL_COMMUNITY): Payer: BC Managed Care – PPO

## 2013-10-02 ENCOUNTER — Ambulatory Visit: Payer: Self-pay

## 2013-10-02 NOTE — Lactation Note (Signed)
This note was copied from the chart of [name]. Lactation Consultation Note   I spoke to mom today. Her twins are now 40 weeks old, and 34 5/7 weeks corrected gestation. Baby A is just over 4 pounds, and is beginning  To show hunger cues. Mom would like to start nuzzling with him during his ng fees, so we are going to do this tomorrow at 12 noon.     Mom reports her milk supply fair - expressing at most 60 mls 8- 10  times a day. She was taking moringa, but does not like to swallow pills. I suggested she try taking them with a spoonful of yogurt or applesauce, to see if this helps.     She is pumping frequently and staying hydrated.     Parents are stressed today, because Baby B is fighting an infection. He had a PCVC placed today.   Patient Name: Catherine Padilla [id number] Date: 10/02/2013 Reason for consult: NICU baby;Follow-up assessment   Maternal Data    Feeding Feeding Type: Breast Milk with Formula added Length of feed: 60 min  LATCH Score/Interventions                      Lactation Tools Discussed/Used     Consult Status Consult Status: Follow-up Date: 10/03/13 Follow-up type: In-patient    Alfred Levins 10/02/2013, 2:18 PM

## 2013-10-30 ENCOUNTER — Ambulatory Visit: Payer: Self-pay

## 2013-10-30 NOTE — Lactation Note (Signed)
This note was copied from the chart of [facility] Shella Lahman. Lactation Consultation Note    Follow up brief consult with this mom of NICU twins, now 76 weeks old. Baby A is home, and Baby B is still in NICU. He is sick today, with aR/O sepsis and distended abdomen. Mom and dad are appropriately upset. Mom reports she is still having severe breast pain, despite treatment with almost 2 weeks of diflucan for a yeast mastitis. Mom also reports some red bumps on her breasts. She was very involved with the medical staff at the baby's bedside this morning, so I did not get to examine mom. She is still on all purpose nipple cream, and I advisedherr to continue with this. I will follow this family in the NICU.  Patient Name: Noble Bodie [id number] Date: 10/30/2013     Maternal Data    Feeding    LATCH Score/Interventions                      Lactation Tools Discussed/Used     Consult Status      Alfred Levins 10/30/2013, 2:40 PM

## 2014-09-24 ENCOUNTER — Encounter (HOSPITAL_COMMUNITY): Payer: Self-pay | Admitting: Obstetrics and Gynecology

## 2015-07-15 IMAGING — US US OB LIMITED
1 series · 12 of 28 positions shown · non-contrast
Comparison: none

[Series 1: us ob limited · 0.26mm/px · 12 of 60 slices shown]
[im 3/60]
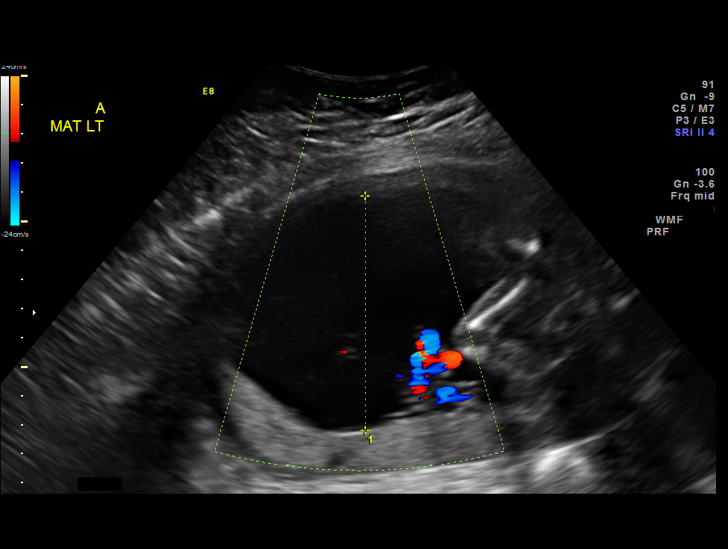
[im 7/60]
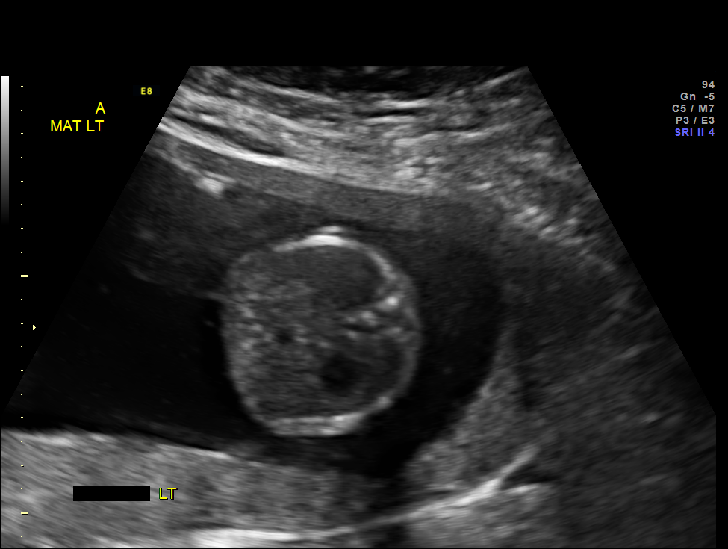
[im 11/60]
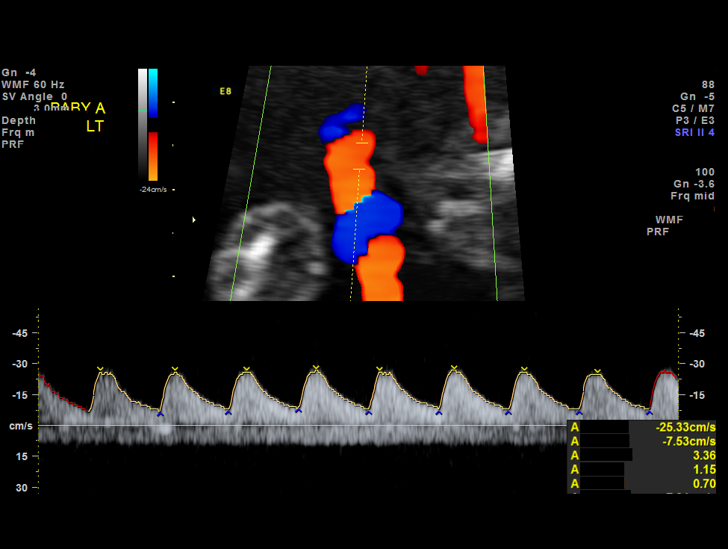
[im 18/60]
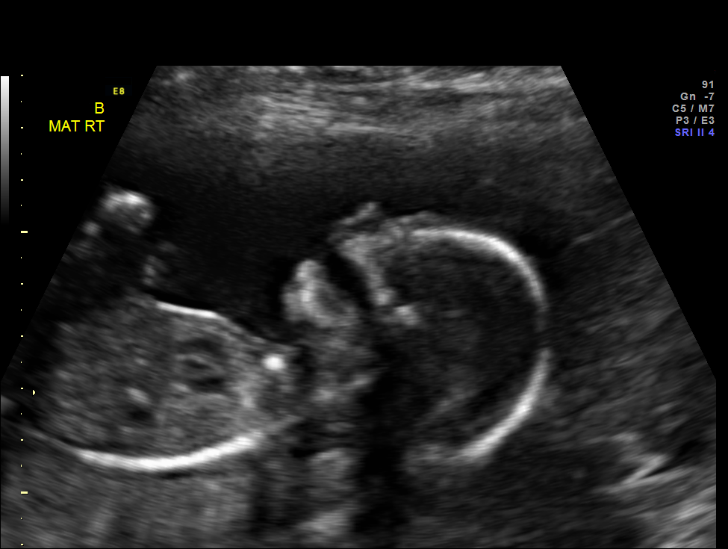
[im 22/60]
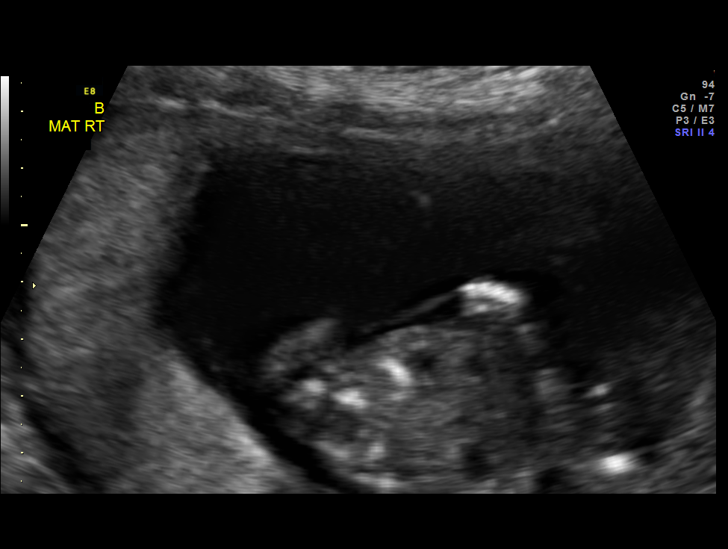
[im 27/60]
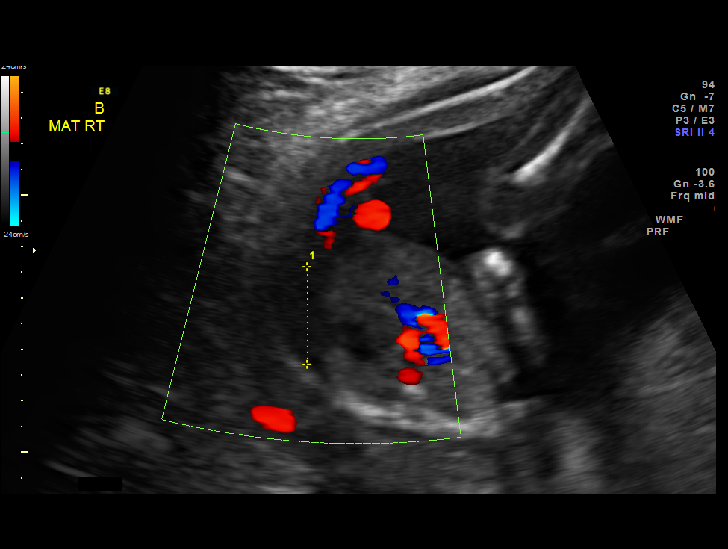
[im 33/60]
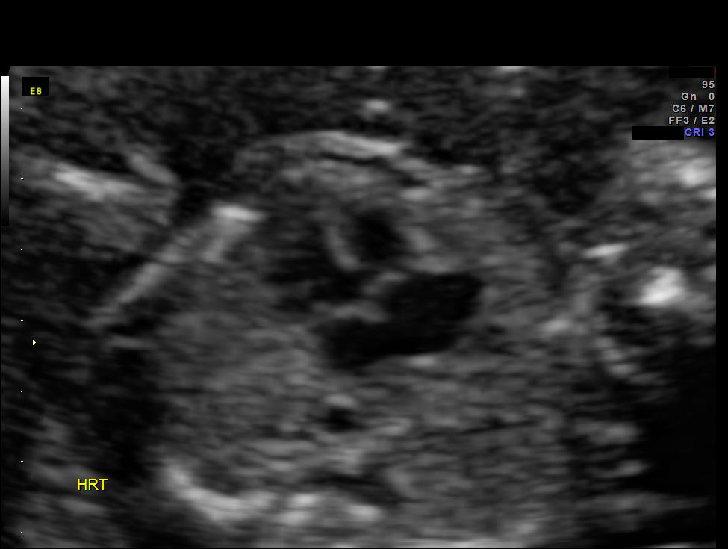
[im 38/60]
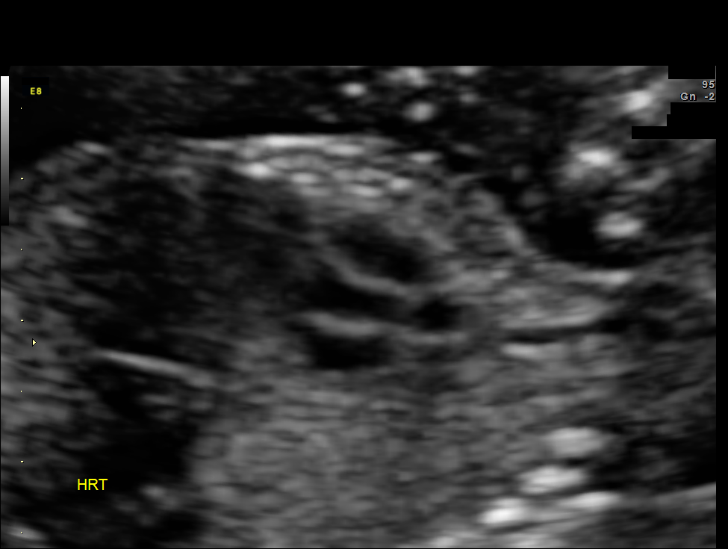
[im 42/60]
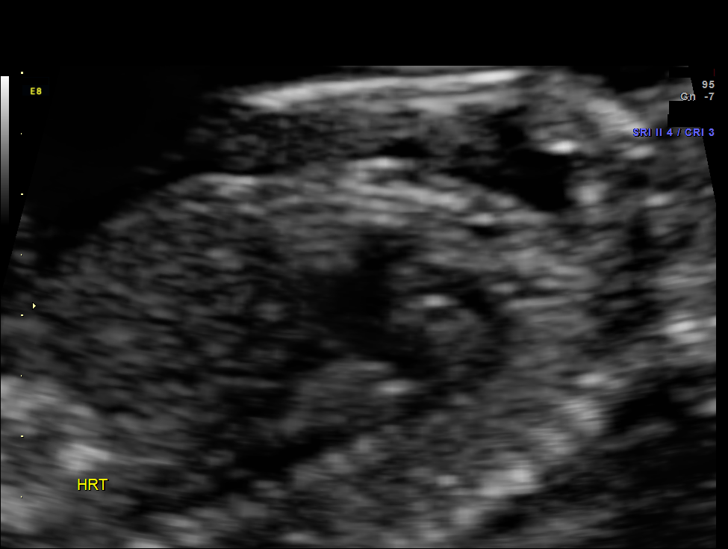
[im 49/60]
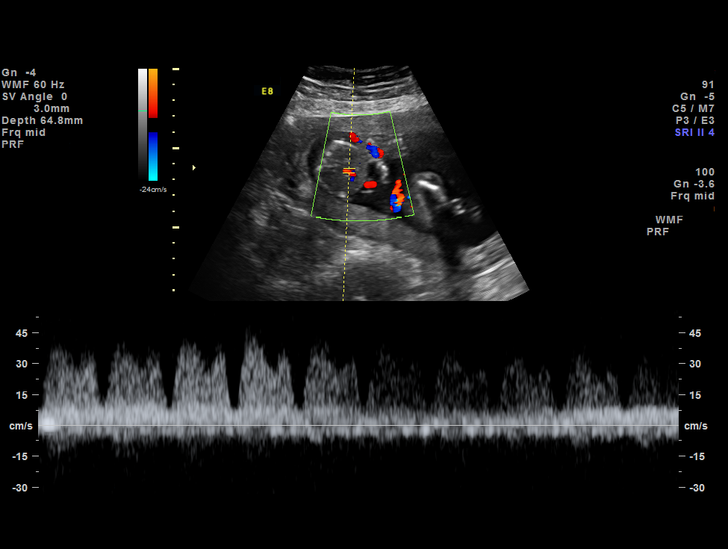
[im 53/60]
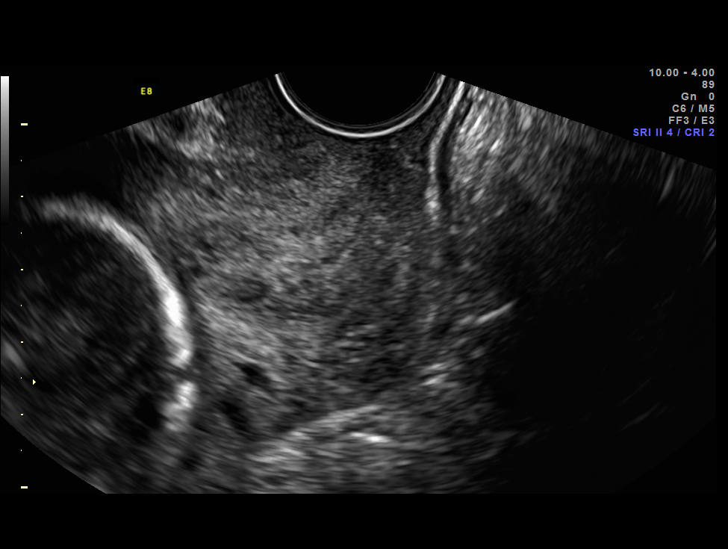
[im 57/60]
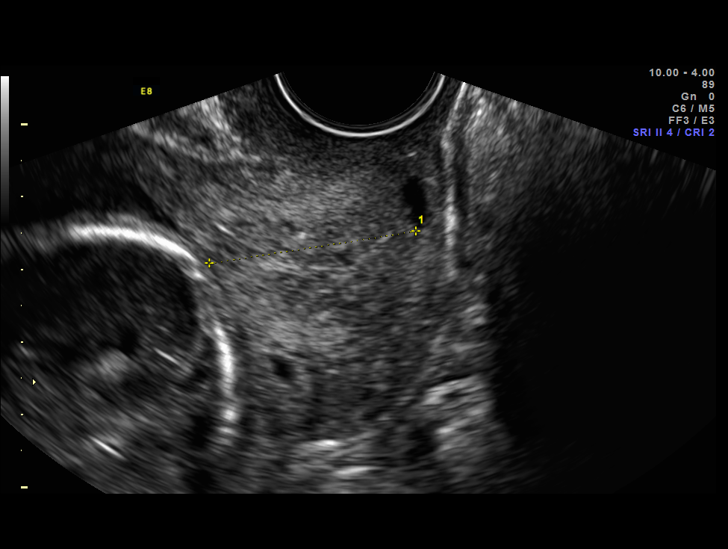

[12 of 28 positions shown; findings below may reference images not displayed]

OBSTETRICS REPORT
                      (Signed Final 06/16/2013 [DATE])

Service(s) Provided

 [HOSPITAL]                                         76815.0
 US UA DOPPLER RE-EVAL                                 76828.1
 US UA DOPPLER ADDL GEST RE EVAL                       76828.2
 US OB TRANSVAGINAL                                    76817.0
Indications

 Twin gestation, [place]
 Rh negative
 Echogenic bowel - Twin B
 Size less than dates (Small for gestational [AGE]
 Twin B
 Decreased amniotic fluid volume - Twin B
Fetal Evaluation (Fetus A)

 Num Of Fetuses:    2
 Fetal Heart Rate:  147                          bpm
 Cardiac Activity:  Observed
 Fetal Lie:         Left Fetus
 Presentation:      Cephalic
 Placenta:          Posterior, above cervical
                    os
 P. Cord            Previously Visualized
 Insertion:

 Membrane Desc:     Dividing
                    Membrane seen
                    - Monochorionic

 Amniotic Fluid
 AFI FV:      Subjectively increased
                                             Larg Pckt:     8.0  cm
Gestational Age (Fetus A)

 LMP:           19w 2d        Date:  02/01/13                 EDD:   11/08/13
 Best:          19w 2d     Det. By:  LMP  (02/01/13)          EDD:   11/08/13
Doppler - Fetal Vessels (Fetus A)

 Umbilical Artery
 S/D:   3.64           28  %tile       RI:
 PI:    1.23                           PSV:       29.96   cm/s
 Umbilical Artery
 Absent DFV:    No     Reverse DFV:    No

Fetal Evaluation (Fetus B)

 Num Of Fetuses:    2
 Fetal Heart Rate:  142                          bpm
 Cardiac Activity:  Observed
 Fetal Lie:         Right Fetus
 Presentation:      Cephalic
 Placenta:          Posterior, above cervical
                    os
 P. Cord            Velamentous ins prev
 Insertion:
                    seen

 Membrane Desc:     Dividing
                    Membrane seen
                    - Monochorionic

 Amniotic Fluid
 AFI FV:      Oligohydramnios
                                             Larg Pckt:     1.9  cm
Gestational Age (Fetus B)

 LMP:           19w 2d        Date:  02/01/13                 EDD:   11/08/13
 Best:          19w 2d     Det. By:  LMP  (02/01/13)          EDD:   11/08/13
Doppler - Fetal Vessels (Fetus B)

 Umbilical Artery
 S/D:   5.73           90  %tile       RI:
 PI:    1.57                           PSV:       33.16   cm/s
 Umbilical Artery
 Absent DFV:    No     Reverse DFV:    No

Cervix Uterus Adnexa

 Cervical Length:    2.8      cm

 Cervix:       Measured transvaginally.

 Left Ovary:    Within normal limits.
 Right Ovary:   Within normal limits.
 Adnexa:     No abnormality visualized.
Comments

 Ms. Joshjax returns for follow up due to MC/DA twins with
 echogenic bowel and an approximately 2 week growth lag of
 twin B.  She was seen earlier this week - noted to have
 oligohydramnios of Twin B with elevated UA Dopplers but no
 evidence of polyhydramnios of Twin A.  Initial impression was
 that this was likely a placental etiology rather than early TTTS.
 Toxo, CMV serologies and CF mutations were negative.  Cell
 free fetal DNA testing is pending.

 On exam today, polyhydramnios is noted with Twin A (max
 verticle pocket of 8 cm).  Oligohydramnios is noted on Twin B
 (max verticle pocket 1-1.9 cm).  Bladders are visualized in
 both twins- albeit small in Twin B.  UA Dopplers are
 somewhat improved compared to earlier this week without
 evidence of absent or reversed diastolic flow.  Both twins had
 normal ductus venosus waveforms.

 Given the new findings of polyhydramnios of Twin A, have
 made arrangements for Ms. Joshjax to be seen at the
 [facility] [REDACTED] next week.  I suspect that this is
 most likly a hybrid presentation - early stage TTTS but also
 some element of placental insufficiency as the etilogy of fetal
 growth restriction in Twin B.
Impression

 MC/DA twin gestation at 19 [DATE] weeks
 Discordant growth with 2 week growth lag of Twin B ( on [DATE])
 Mild polyhydramnios noted in Twin A (max verticle pocket 8
 cm)
 Oligohydramnios noted in Twin B (max verticle pocket 1-1.9
 cm)
 No absent or reversed diastolic flow on UA Dopplers on
 either twin.
 Normal ductus venosus waveforms
 Bladders visualized in both Twins.
Recommendations

 Ms. Treasure Stiffler be evaluated at the [facility] [REDACTED]
 early next week - they prefer to perform fetal echos there (will
 cancel scheduled fetal echos that were previously scheduled)
 Follow up for limited ultrasound here next week
 Growth scan in 2 weeks.

 questions or concerns.

## 2015-08-05 IMAGING — US US UA CORD DOPPLER
1 series · 12 of 28 positions shown · non-contrast
Comparison: none

[Series 1: us ua cord doppler · 12 of 36 slices shown]
[im 2/36]
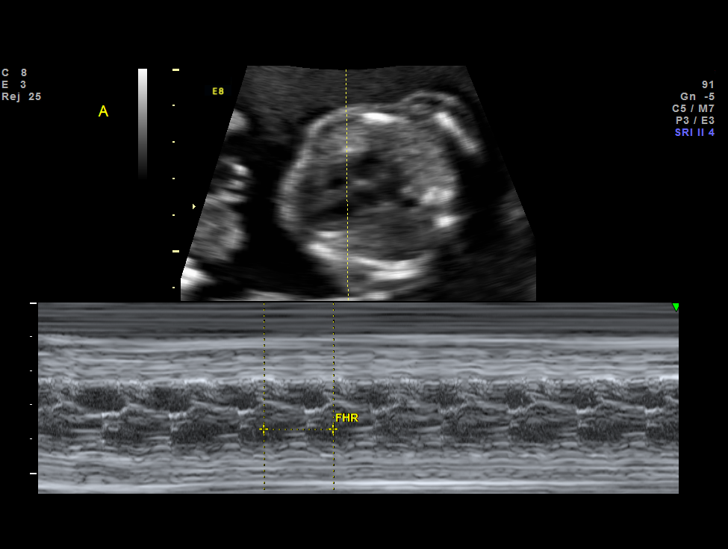
[im 4/36]
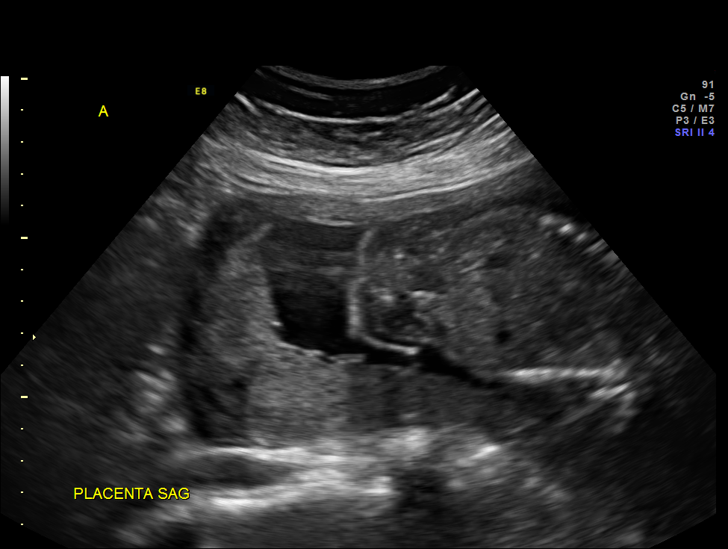
[im 7/36]
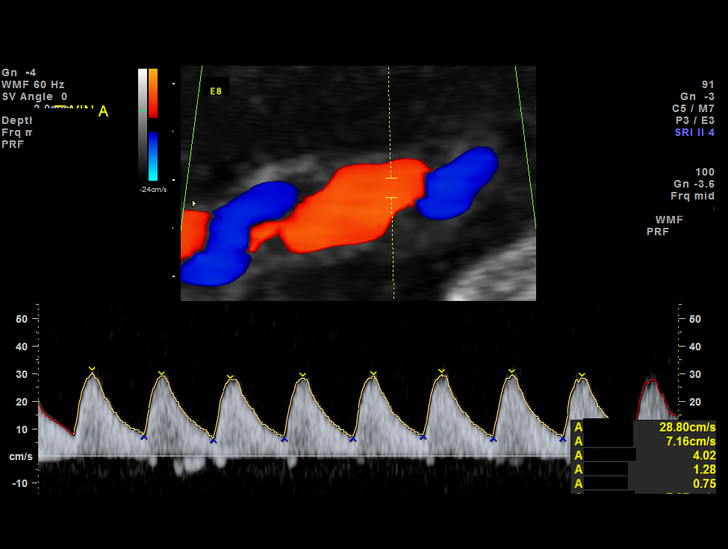
[im 11/36]
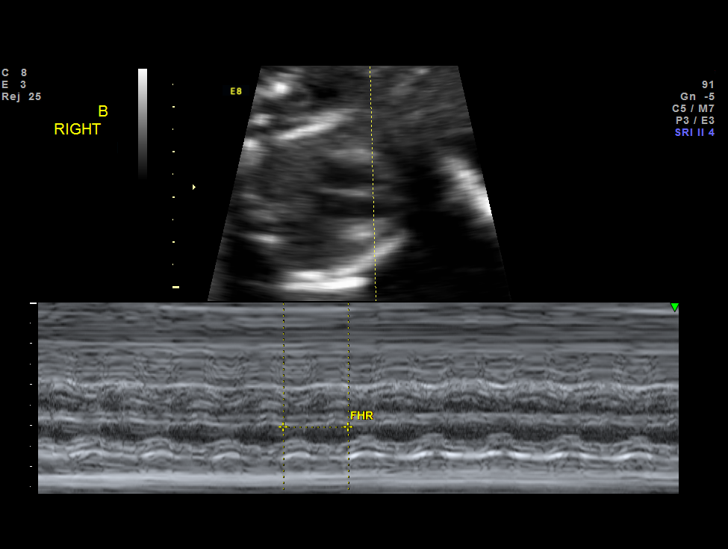
[im 13/36]
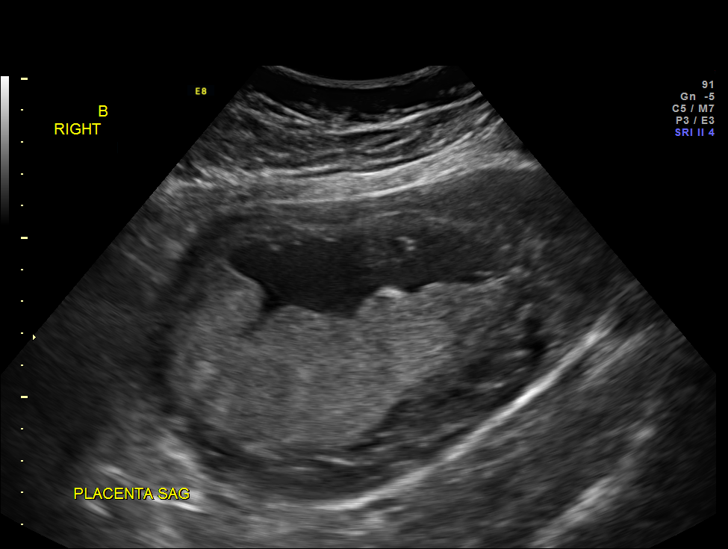
[im 16/36]
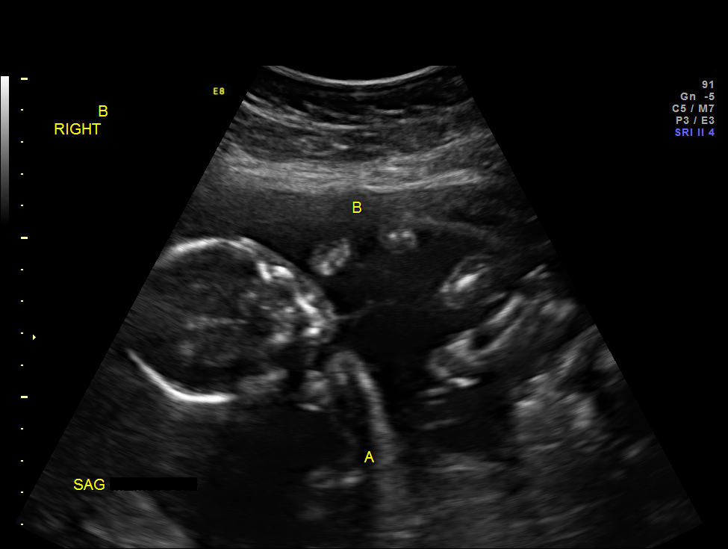
[im 20/36]
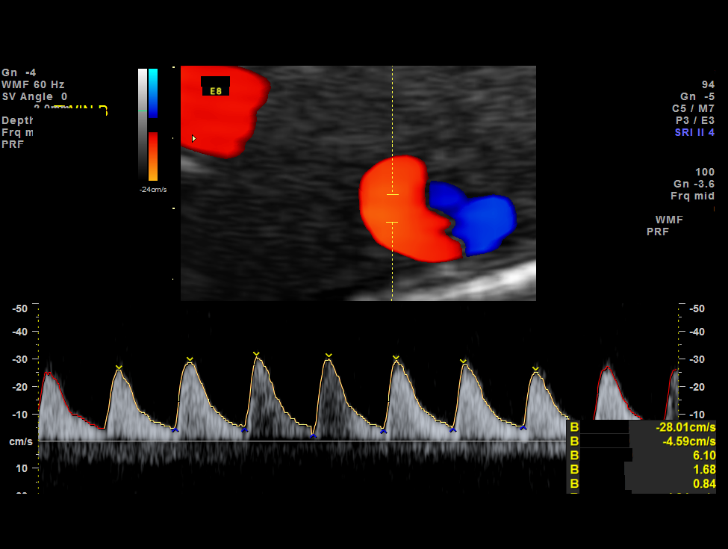
[im 23/36]
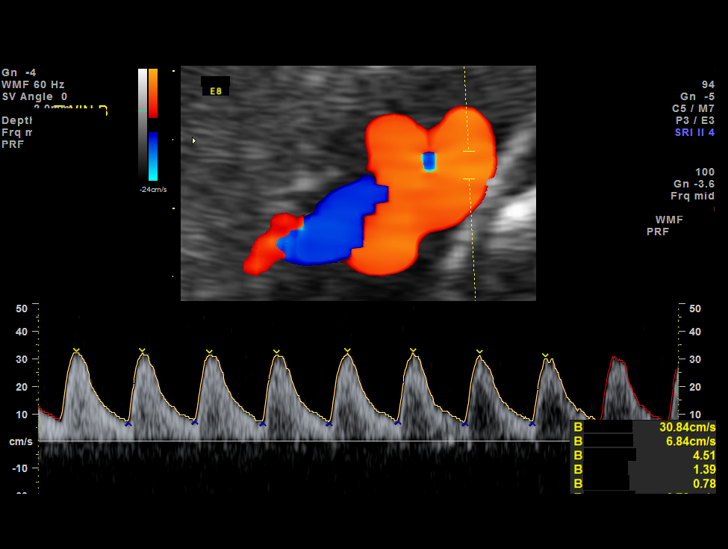
[im 25/36]
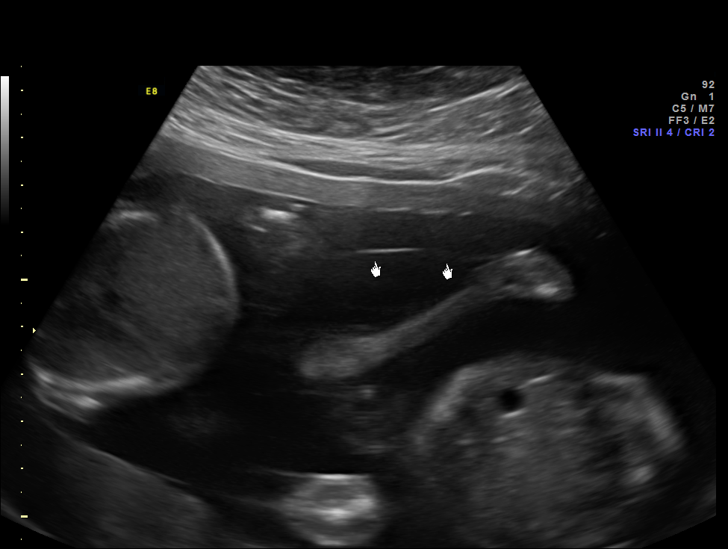
[im 29/36]
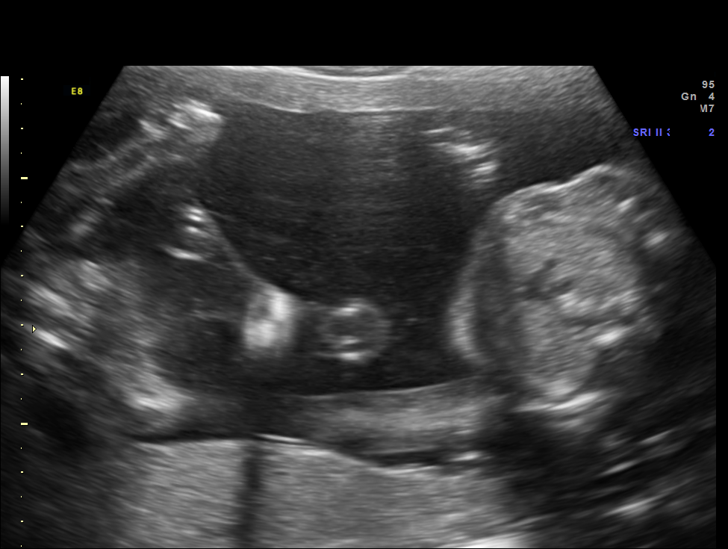
[im 32/36]
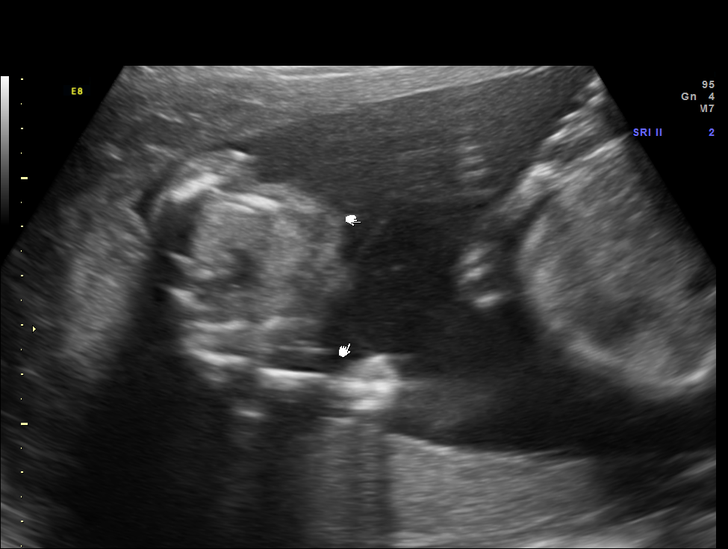
[im 34/36]
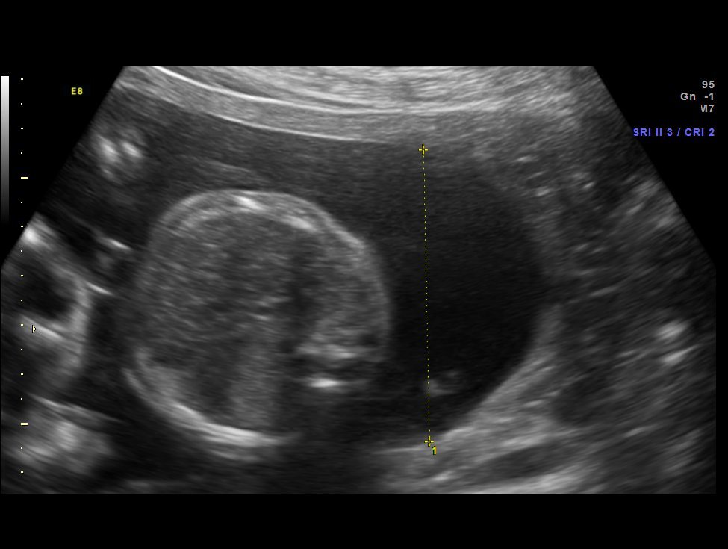

[12 of 28 positions shown; findings below may reference images not displayed]

OBSTETRICS REPORT
                      (Signed Final 07/07/2013 [DATE])

Service(s) Provided

 [HOSPITAL]                                         76815.0
 US UA CORD DOPPLER                                    76820.0
 US UA ADDL GEST                                       76820.1
Indications

 [employer] pregnancy, twins discordant,
 antepartum
 Rh negative
 Echogenic bowel - Twin B
 Size less than dates (Small for gestational [AGE]
 Twin B
 Decreased amniotic fluid volume - Twin B
Fetal Evaluation (Fetus A)

 Num Of Fetuses:    2
 Fetal Heart Rate:  145                          bpm
 Cardiac Activity:  Observed
 Fetal Lie:         Left Fetus
 Presentation:      Cephalic
 Placenta:          Posterior, above cervical
                    os
 P. Cord            Previously Visualized
 Insertion:

 Membrane Desc:     Dividing
                    Membrane seen
                    - Monochorionic

 Amniotic Fluid
 AFI FV:      Subjectively within normal limits
                                             Larg Pckt:     7.0  cm
Gestational Age (Fetus A)

 LMP:           22w 2d        Date:  02/01/13                 EDD:   11/08/13
 Best:          22w 2d     Det. By:  LMP  (02/01/13)          EDD:   11/08/13
Doppler - Fetal Vessels (Fetus A)

 Umbilical Artery
 S/D:   3.69           48  %tile       RI:
 PI:    1.21                           PSV:       32.9    cm/s
 Umbilical Artery
 Absent DFV:    No     Reverse DFV:    No

Fetal Evaluation (Fetus B)

 Num Of Fetuses:    2
 Fetal Heart Rate:  155                          bpm
 Cardiac Activity:  Observed
 Fetal Lie:         Right Fetus
 Presentation:      Breech
 Placenta:          Posterior, above cervical
                    os
 P. Cord            Velamentous ins prev
 Insertion:
                    seen

 Membrane Desc:     Dividing
                    Membrane seen
                    - Monochorionic

 Amniotic Fluid
 AFI FV:      Subjectively low normal range
                                             Larg Pckt:     3.1  cm
Gestational Age (Fetus B)

 LMP:           22w 2d        Date:  02/01/13                 EDD:   11/08/13
 Best:          22w 2d     Det. By:  LMP  (02/01/13)          EDD:   11/08/13
Doppler - Fetal Vessels (Fetus B)

 Umbilical Artery
 S/D:   5.55           95  %tile       RI:
 PI:    1.58                           PSV:       31.4    cm/s
 Umbilical Artery
 Absent DFV:    No     Reverse DFV:    No

Cervix Uterus Adnexa

 Cervical Length:    2.8      cm

 Cervix:       Normal appearance by transabdominal scan.
Impression

 Monochorionic/diamniotic twin pregnancy at 22+2 weeks

 Twin A: normal amniotic fluid volume; UA dopplers were
 normal for this GA

 Twin B: fetal growth restriction; low normal amniotic fluid
 volume; UA dopplers were in the high normal range for this
 GA

 Subjectively, I think the AFVs have improved with less for
 Twin A and more for Twin B.
Recommendations
 Continue weekly assessments; growth in 1-2 weeks

 questions or concerns.

## 2017-04-09 DIAGNOSIS — G8929 Other chronic pain: Secondary | ICD-10-CM | POA: Insufficient documentation

## 2017-04-09 DIAGNOSIS — J0141 Acute recurrent pansinusitis: Secondary | ICD-10-CM | POA: Insufficient documentation

## 2017-04-09 DIAGNOSIS — M26621 Arthralgia of right temporomandibular joint: Secondary | ICD-10-CM | POA: Insufficient documentation

## 2017-04-09 DIAGNOSIS — J341 Cyst and mucocele of nose and nasal sinus: Secondary | ICD-10-CM | POA: Insufficient documentation

## 2018-01-20 ENCOUNTER — Other Ambulatory Visit: Payer: Self-pay | Admitting: Internal Medicine

## 2018-01-20 DIAGNOSIS — Z1231 Encounter for screening mammogram for malignant neoplasm of breast: Secondary | ICD-10-CM

## 2018-02-07 ENCOUNTER — Ambulatory Visit
Admission: RE | Admit: 2018-02-07 | Discharge: 2018-02-07 | Disposition: A | Discharge status: Home / Self Care | Payer: BC Managed Care – PPO | Source: Ambulatory Visit | Attending: Internal Medicine | Admitting: Internal Medicine

## 2018-02-07 DIAGNOSIS — Z1231 Encounter for screening mammogram for malignant neoplasm of breast: Secondary | ICD-10-CM

## 2018-02-08 ENCOUNTER — Other Ambulatory Visit: Payer: Self-pay | Admitting: Internal Medicine

## 2018-02-08 DIAGNOSIS — R928 Other abnormal and inconclusive findings on diagnostic imaging of breast: Secondary | ICD-10-CM

## 2018-02-09 ENCOUNTER — Other Ambulatory Visit: Payer: Self-pay | Admitting: Internal Medicine

## 2018-02-09 ENCOUNTER — Ambulatory Visit
Admission: RE | Admit: 2018-02-09 | Discharge: 2018-02-09 | Disposition: A | Discharge status: Home / Self Care | Payer: BC Managed Care – PPO | Source: Ambulatory Visit | Attending: Internal Medicine | Admitting: Internal Medicine

## 2018-02-09 DIAGNOSIS — R928 Other abnormal and inconclusive findings on diagnostic imaging of breast: Secondary | ICD-10-CM

## 2018-02-09 DIAGNOSIS — N6310 Unspecified lump in the right breast, unspecified quadrant: Secondary | ICD-10-CM

## 2018-02-11 ENCOUNTER — Other Ambulatory Visit: Payer: BC Managed Care – PPO

## 2018-02-15 ENCOUNTER — Ambulatory Visit
Admission: RE | Admit: 2018-02-15 | Discharge: 2018-02-15 | Disposition: A | Discharge status: Home / Self Care | Payer: BC Managed Care – PPO | Source: Ambulatory Visit | Attending: Internal Medicine | Admitting: Internal Medicine

## 2018-02-15 ENCOUNTER — Other Ambulatory Visit: Payer: Self-pay | Admitting: Internal Medicine

## 2018-02-15 DIAGNOSIS — N6310 Unspecified lump in the right breast, unspecified quadrant: Secondary | ICD-10-CM

## 2018-02-15 HISTORY — PX: BREAST BIOPSY: SHX20

## 2019-01-30 ENCOUNTER — Other Ambulatory Visit: Payer: Self-pay | Admitting: Internal Medicine

## 2019-01-30 DIAGNOSIS — Z1231 Encounter for screening mammogram for malignant neoplasm of breast: Secondary | ICD-10-CM

## 2019-02-23 ENCOUNTER — Ambulatory Visit: Payer: BC Managed Care – PPO

## 2019-04-10 ENCOUNTER — Ambulatory Visit
Admission: RE | Admit: 2019-04-10 | Discharge: 2019-04-10 | Disposition: A | Discharge status: Home / Self Care | Payer: BC Managed Care – PPO | Source: Ambulatory Visit | Attending: Internal Medicine | Admitting: Internal Medicine

## 2019-04-10 ENCOUNTER — Other Ambulatory Visit: Payer: Self-pay

## 2019-04-10 DIAGNOSIS — Z1231 Encounter for screening mammogram for malignant neoplasm of breast: Secondary | ICD-10-CM

## 2019-04-12 ENCOUNTER — Other Ambulatory Visit: Payer: Self-pay | Admitting: Internal Medicine

## 2019-04-12 DIAGNOSIS — R928 Other abnormal and inconclusive findings on diagnostic imaging of breast: Secondary | ICD-10-CM

## 2019-04-14 ENCOUNTER — Other Ambulatory Visit: Payer: Self-pay | Admitting: Obstetrics and Gynecology

## 2019-04-14 DIAGNOSIS — R928 Other abnormal and inconclusive findings on diagnostic imaging of breast: Secondary | ICD-10-CM

## 2019-04-18 ENCOUNTER — Ambulatory Visit
Admission: RE | Admit: 2019-04-18 | Discharge: 2019-04-18 | Disposition: A | Discharge status: Home / Self Care | Payer: BC Managed Care – PPO | Source: Ambulatory Visit | Attending: Internal Medicine | Admitting: Internal Medicine

## 2019-04-18 ENCOUNTER — Other Ambulatory Visit: Payer: Self-pay

## 2019-04-18 DIAGNOSIS — R928 Other abnormal and inconclusive findings on diagnostic imaging of breast: Secondary | ICD-10-CM

## 2019-08-04 ENCOUNTER — Ambulatory Visit: Payer: BC Managed Care – PPO | Admitting: Sports Medicine

## 2019-08-04 ENCOUNTER — Other Ambulatory Visit: Payer: Self-pay

## 2019-08-04 DIAGNOSIS — M79671 Pain in right foot: Secondary | ICD-10-CM

## 2019-08-04 DIAGNOSIS — B359 Dermatophytosis, unspecified: Secondary | ICD-10-CM

## 2019-08-04 DIAGNOSIS — M79672 Pain in left foot: Secondary | ICD-10-CM

## 2019-08-04 DIAGNOSIS — O30039 Twin pregnancy, monochorionic/diamniotic, unspecified trimester: Secondary | ICD-10-CM | POA: Insufficient documentation

## 2019-08-04 DIAGNOSIS — F419 Anxiety disorder, unspecified: Secondary | ICD-10-CM | POA: Insufficient documentation

## 2019-08-04 DIAGNOSIS — L84 Corns and callosities: Secondary | ICD-10-CM

## 2019-08-04 DIAGNOSIS — N61 Mastitis without abscess: Secondary | ICD-10-CM | POA: Insufficient documentation

## 2019-08-04 MED ORDER — NYSTATIN-TRIAMCINOLONE 100000-0.1 UNIT/GM-% EX OINT: 1.0000 "application " | TOPICAL_OINTMENT | Freq: Two times a day (BID) | CUTANEOUS | 0 refills | Status: AC

## 2019-08-04 NOTE — Patient Instructions (Signed)
Colver Feet for dry skin on heels

## 2019-08-05 ENCOUNTER — Encounter: Payer: Self-pay | Admitting: Sports Medicine

## 2019-08-05 NOTE — Progress Notes (Signed)
Subjective: Catherine Padilla is a 33 y.o. female patient seen today in office with complaint of mildly painful thickened callus skin to heels.  Patient reports that sometimes the skin on the backs of her heel is very itchy.  Patient reports that she has had this problem for a couple of years states that she sometimes uses a cheese grater to scrape the backs of her heels at least once a week.  Patient admits that there is some occasional dull and achy pain to the area at most 3 out of 10 when the skin is cracking.  Patient reports that sometimes her heels are aggravated with direct pressure or rubbing from shoes.  Patient denies redness warmth drainage or any other constitutional symptoms at this time. Patient has no other pedal complaints at this time.   Review of Systems  All other systems reviewed and are negative.    Patient Active Problem List   Diagnosis Date Noted  . Anxiety 08/04/2019  . Mastitis 08/04/2019  . Monochorionic diamniotic twin pregnancy 08/04/2019  . Postpartum state 08/04/2019  . Acute recurrent pansinusitis 04/09/2017  . 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 Chronic nonintractable headache 04/09/2017  . 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 ALPRAZolam (XANAX) 0.25 MG tablet     . sertraline (ZOLOFT) 100 MG tablet TK 1 T PO QD    . acetaminophen (TYLENOL) 500 MG tablet Take 500 mg by mouth every 6 (six) hours as needed for pain.    Marland Kitchen. azithromycin (ZITHROMAX) 250 MG tablet azithromycin 250 mg tablet    . cefdinir (OMNICEF) 300 MG capsule cefdinir 300 mg capsule    . chlorpheniramine-HYDROcodone (TUSSIONEX) 10-8 MG/5ML SUER hydrocodone 10 mg-chlorpheniramine 8 mg/5 mL oral susp extend.rel 12hr    . famotidine (PEPCID) 20 MG tablet famotidine 20 mg tablet    . fluconazole (DIFLUCAN) 150 MG tablet fluconazole 150 mg tablet    . fluticasone (FLONASE) 50 MCG/ACT nasal  spray INSTILL ONE SPRAY IN EACH NOSTRIL ONCE DAILY    . ibuprofen (ADVIL,MOTRIN) 600 MG tablet Take 1 tablet (600 mg total) by mouth every 6 (six) hours. (Patient not taking: Reported on 08/04/2019) 30 tablet 0  . ondansetron (ZOFRAN) 8 MG tablet ondansetron HCl 8 mg tablet    . oseltamivir (TAMIFLU) 75 MG capsule Tamiflu 75 mg capsule    . oxyCODONE-acetaminophen (PERCOCET/ROXICET) 5-325 MG per tablet Take 1-2 tablets by mouth every 4 (four) hours as needed. (Patient not taking: Reported on 08/04/2019) 25 tablet 0  . phentermine (ADIPEX-P) 37.5 MG tablet Take by mouth.    . polyethylene glycol (MIRALAX / GLYCOLAX) packet Take 17 g by mouth daily.    . predniSONE (DELTASONE) 20 MG tablet prednisone 20 mg tablet    . Prenatal Vit-Fe Fumarate-FA (PRENATAL MULTIVITAMIN) TABS Take 1 tablet by mouth daily at 12 noon.    . promethazine (PHENERGAN) 25 MG tablet promethazine 25 mg tablet    . pseudoephedrine (SUDAFED) 30 MG tablet Take 30 mg by mouth every 4 (four) hours as needed for congestion.    . rho, d, immune globulin (RHOPHYLAC) 1500 UNIT/2ML SOSY injection Rhophylac 1,500 unit (300 mcg)/2 mL injection syringe    . sulfamethoxazole-trimethoprim (BACTRIM DS) 800-160 MG tablet TK 1 T PO BID FOR 7 DAYS     No current facility-administered medications on file prior to visit.     Allergies  Allergen Reactions  .  Phentermine     Objective: Physical Exam  General: Well developed, nourished, no acute distress, awake, alert and oriented x 3  Vascular: Dorsalis pedis artery 2/4 bilateral, Posterior tibial artery 2/4 bilateral, skin temperature warm to warm proximal to distal bilateral lower extremities, no varicosities, pedal hair present bilateral.  Neurological: Gross sensation present via light touch bilateral.   Dermatological: Skin is warm, dry, and supple bilateral, Nails are normal, no webspace macerations present bilateral, no open lesions present bilateral, ++hyperkeratotic tissue present  bilateral heel with cracking right heel worse than left. No signs of infection bilateral.  Musculoskeletal:+ pes planus asymptomatic boney deformities noted bilateral. Muscular strength within normal limits without painon range of motion. No pain with calf compression bilateral.  Assessment and Plan:  Problem List Items Addressed This Visit    None    Visit Diagnoses    Callus of heel    -  Primary   Relevant Medications   nystatin-triamcinolone ointment (MYCOLOG)   Tinea       Relevant Medications   azithromycin (ZITHROMAX) 250 MG tablet   cefdinir (OMNICEF) 300 MG capsule   fluconazole (DIFLUCAN) 150 MG tablet   oseltamivir (TAMIFLU) 75 MG capsule   sulfamethoxazole-trimethoprim (BACTRIM DS) 800-160 MG tablet   rho, d, immune globulin (RHOPHYLAC) 1500 UNIT/2ML SOSY injection   nystatin-triamcinolone ointment (MYCOLOG)   Pain of both heels         -Examined patient.  -Discussed treatment options for painful callused skin at heels with occassional itchiness  -Rx Nystatin for itchiness -Recommend OTC okeffe healthy feet under occlusion and good skin care habits -Advised use of pumice stone -Dispensed heel cushions and advised to refrain from wearing flip flops too much because of shear to heels -Patient to return after 6 weeks if needed for re-check or sooner if problems or issues arise.  Landis Martins, DPM

## 2019-09-15 ENCOUNTER — Ambulatory Visit: Payer: BC Managed Care – PPO | Admitting: Sports Medicine

## 2019-10-26 ENCOUNTER — Ambulatory Visit: Payer: BC Managed Care – PPO | Admitting: Sports Medicine

## 2020-01-02 ENCOUNTER — Other Ambulatory Visit: Payer: Self-pay | Admitting: Internal Medicine

## 2020-01-02 DIAGNOSIS — R748 Abnormal levels of other serum enzymes: Secondary | ICD-10-CM

## 2020-01-09 ENCOUNTER — Ambulatory Visit
Admission: RE | Admit: 2020-01-09 | Discharge: 2020-01-09 | Disposition: A | Discharge status: Home / Self Care | Payer: BC Managed Care – PPO | Source: Ambulatory Visit | Attending: Internal Medicine | Admitting: Internal Medicine

## 2020-01-09 DIAGNOSIS — R748 Abnormal levels of other serum enzymes: Secondary | ICD-10-CM

## 2020-01-10 ENCOUNTER — Other Ambulatory Visit: Payer: BC Managed Care – PPO

## 2020-04-04 ENCOUNTER — Other Ambulatory Visit: Payer: Self-pay | Admitting: Internal Medicine

## 2020-04-04 DIAGNOSIS — Z1231 Encounter for screening mammogram for malignant neoplasm of breast: Secondary | ICD-10-CM

## 2020-04-10 ENCOUNTER — Other Ambulatory Visit: Payer: Self-pay

## 2020-04-10 ENCOUNTER — Ambulatory Visit
Admission: RE | Admit: 2020-04-10 | Discharge: 2020-04-10 | Disposition: A | Discharge status: Home / Self Care | Payer: BC Managed Care – PPO | Source: Ambulatory Visit | Attending: Internal Medicine | Admitting: Internal Medicine

## 2020-04-10 DIAGNOSIS — Z1231 Encounter for screening mammogram for malignant neoplasm of breast: Secondary | ICD-10-CM

## 2021-02-19 ENCOUNTER — Other Ambulatory Visit: Payer: Self-pay | Admitting: Internal Medicine

## 2021-02-19 DIAGNOSIS — Z1231 Encounter for screening mammogram for malignant neoplasm of breast: Secondary | ICD-10-CM

## 2021-02-28 ENCOUNTER — Other Ambulatory Visit: Payer: Self-pay | Admitting: Internal Medicine

## 2021-02-28 DIAGNOSIS — Z1231 Encounter for screening mammogram for malignant neoplasm of breast: Secondary | ICD-10-CM

## 2021-03-01 DIAGNOSIS — Z1231 Encounter for screening mammogram for malignant neoplasm of breast: Secondary | ICD-10-CM

## 2021-04-23 ENCOUNTER — Ambulatory Visit
Admission: RE | Admit: 2021-04-23 | Discharge: 2021-04-23 | Disposition: A | Discharge status: Home / Self Care | Payer: BC Managed Care – PPO | Source: Ambulatory Visit

## 2021-04-23 ENCOUNTER — Other Ambulatory Visit: Payer: Self-pay

## 2021-04-23 DIAGNOSIS — Z1231 Encounter for screening mammogram for malignant neoplasm of breast: Secondary | ICD-10-CM

## 2022-04-29 ENCOUNTER — Other Ambulatory Visit: Payer: Self-pay | Admitting: Internal Medicine

## 2022-04-29 DIAGNOSIS — Z1231 Encounter for screening mammogram for malignant neoplasm of breast: Secondary | ICD-10-CM

## 2022-06-05 ENCOUNTER — Ambulatory Visit
Admission: RE | Admit: 2022-06-05 | Discharge: 2022-06-05 | Disposition: A | Discharge status: Home / Self Care | Payer: BC Managed Care – PPO | Source: Ambulatory Visit | Attending: Internal Medicine | Admitting: Internal Medicine

## 2022-06-05 DIAGNOSIS — Z1231 Encounter for screening mammogram for malignant neoplasm of breast: Secondary | ICD-10-CM

## 2022-07-01 ENCOUNTER — Other Ambulatory Visit: Payer: Self-pay | Admitting: Internal Medicine

## 2022-07-01 DIAGNOSIS — R42 Dizziness and giddiness: Secondary | ICD-10-CM

## 2022-07-24 ENCOUNTER — Ambulatory Visit
Admission: RE | Admit: 2022-07-24 | Discharge: 2022-07-24 | Disposition: A | Discharge status: Home / Self Care | Payer: BC Managed Care – PPO | Source: Ambulatory Visit | Attending: Internal Medicine | Admitting: Internal Medicine

## 2022-07-24 DIAGNOSIS — R42 Dizziness and giddiness: Secondary | ICD-10-CM

## 2023-06-16 ENCOUNTER — Other Ambulatory Visit: Payer: Self-pay | Admitting: Internal Medicine

## 2023-06-16 DIAGNOSIS — Z1231 Encounter for screening mammogram for malignant neoplasm of breast: Secondary | ICD-10-CM

## 2023-07-07 ENCOUNTER — Ambulatory Visit
Admission: RE | Admit: 2023-07-07 | Discharge: 2023-07-07 | Disposition: A | Discharge status: Home / Self Care | Payer: BC Managed Care – PPO | Source: Ambulatory Visit

## 2023-07-07 DIAGNOSIS — Z1231 Encounter for screening mammogram for malignant neoplasm of breast: Secondary | ICD-10-CM

## 2023-07-12 ENCOUNTER — Other Ambulatory Visit: Payer: Self-pay | Admitting: Internal Medicine

## 2023-07-12 DIAGNOSIS — R928 Other abnormal and inconclusive findings on diagnostic imaging of breast: Secondary | ICD-10-CM

## 2023-07-21 ENCOUNTER — Other Ambulatory Visit: Payer: Self-pay | Admitting: Internal Medicine

## 2023-07-21 ENCOUNTER — Ambulatory Visit
Admission: RE | Admit: 2023-07-21 | Discharge: 2023-07-21 | Disposition: A | Discharge status: Home / Self Care | Payer: BC Managed Care – PPO | Source: Ambulatory Visit | Attending: Internal Medicine | Admitting: Internal Medicine

## 2023-07-21 DIAGNOSIS — N6320 Unspecified lump in the left breast, unspecified quadrant: Secondary | ICD-10-CM

## 2023-07-21 DIAGNOSIS — R928 Other abnormal and inconclusive findings on diagnostic imaging of breast: Secondary | ICD-10-CM

## 2023-07-23 ENCOUNTER — Ambulatory Visit
Admission: RE | Admit: 2023-07-23 | Discharge: 2023-07-23 | Disposition: A | Discharge status: Home / Self Care | Payer: BC Managed Care – PPO | Source: Ambulatory Visit | Attending: Internal Medicine | Admitting: Internal Medicine

## 2023-07-23 DIAGNOSIS — N6320 Unspecified lump in the left breast, unspecified quadrant: Secondary | ICD-10-CM

## 2023-07-23 DIAGNOSIS — R928 Other abnormal and inconclusive findings on diagnostic imaging of breast: Secondary | ICD-10-CM

## 2023-07-23 HISTORY — PX: BREAST BIOPSY: SHX20

## 2023-07-28 ENCOUNTER — Other Ambulatory Visit: Payer: BC Managed Care – PPO

## 2024-03-31 ENCOUNTER — Ambulatory Visit (HOSPITAL_BASED_OUTPATIENT_CLINIC_OR_DEPARTMENT_OTHER)
Admission: RE | Admit: 2024-03-31 | Discharge: 2024-03-31 | Disposition: A | Discharge status: Home / Self Care | Source: Ambulatory Visit | Attending: Internal Medicine | Admitting: Internal Medicine

## 2024-03-31 ENCOUNTER — Encounter (HOSPITAL_BASED_OUTPATIENT_CLINIC_OR_DEPARTMENT_OTHER): Payer: Self-pay

## 2024-03-31 VITALS — BP 162/106 | HR 86 | Temp 98.5°F | Resp 20

## 2024-03-31 DIAGNOSIS — R21 Rash and other nonspecific skin eruption: Secondary | ICD-10-CM

## 2024-03-31 NOTE — ED Triage Notes (Signed)
 Onset yesterday of a red rash to left forearm, then right forearm, then left foot, then right foot. Is on day 9 of taking cefdinir. Unsure if related to medication. States rash has come and gone. Has pictures of left forearm being very red approx 1 hour ago.

## 2024-03-31 NOTE — ED Provider Notes (Signed)
 [facility]    CSN: [id number] Arrival date & time: 03/31/24  1250      History   Chief Complaint Chief Complaint  Patient presents with   Allergic Reaction    Entered by patient   Rash    HPI Catherine Padilla is a 38 y.o. female.   38 year old female presents today with rash.  This rash has been coming and going over the past couple days.  The rash will pop up on random extremities.  The rash usually resolves and then returns.  The rash is not very itchy or painful.  She has been a lot of stress recently.  Is currently on cefdinir for sinus infection on day 8. No trouble swallowing or breathing. No current rash. Did have pics of previous flare ups.    Allergic Reaction Presenting symptoms: rash   Rash   History reviewed. No pertinent past medical history.  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 Avanell Bob, MD;  Location: [facility];  Service: Obstetrics;  Laterality: N/A;   NO PAST SURGERIES      OB History     Gravida  1   Para  1   Term      Preterm  1   AB      Living  2      SAB      IAB      Ectopic      Multiple  1   Live Births  2            Home Medications    Prior to Admission medications   Medication Sig Start Date End Date Taking? Authorizing Provider  acetaminophen  (TYLENOL ) 500 MG tablet Take 500 mg by mouth every 6 (six) hours as needed for  pain.    [provider]  ALPRAZolam [place]) 0.25 MG tablet  01/13/17   [provider]  azithromycin (ZITHROMAX) 250 MG tablet azithromycin 250 mg tablet    [provider]  cefdinir (OMNICEF) 300 MG capsule cefdinir 300 mg capsule    [provider]  chlorpheniramine-HYDROcodone  (TUSSIONEX) 10-8 MG/5ML SUER hydrocodone  10 mg-chlorpheniramine 8 mg/5 mL oral susp extend.rel 12hr    [provider]  famotidine  (PEPCID ) 20 MG tablet famotidine  20 mg tablet    [provider]  fluconazole (DIFLUCAN) 150 MG tablet fluconazole 150 mg tablet    [provider]  fluticasone (FLONASE) 50 MCG/ACT nasal spray INSTILL ONE SPRAY IN EACH NOSTRIL ONCE DAILY 01/18/17   [provider]  ibuprofen  (ADVIL ,MOTRIN ) 600 MG tablet Take 1 tablet (600 mg total) by mouth every 6 (six) hours. Patient not taking: Reported on 08/04/2019 09/12/13   Astrid Blamer, MD  nystatin -triamcinolone  ointment (MYCOLOG) Apply 1 application topically 2 (two) times daily. Patient not taking: Reported on 08/04/2019 08/04/19   Stover, Titorya, DPM  ondansetron  (ZOFRAN ) 8 MG tablet ondansetron  HCl 8 mg tablet  [provider]  oseltamivir (TAMIFLU) 75 MG capsule Tamiflu 75 mg capsule    [provider]  oxyCODONE -acetaminophen  (PERCOCET/ROXICET) 5-325 MG per tablet Take 1-2 tablets by mouth every 4 (four) hours as needed. Patient not taking: Reported on 08/04/2019 09/12/13   Astrid Blamer, MD  phentermine (ADIPEX-P) 37.5 MG tablet Take by mouth. 01/03/19   [provider]  polyethylene glycol (MIRALAX / GLYCOLAX) packet Take 17 g by mouth daily.    [provider]  predniSONE (DELTASONE) 20 MG tablet prednisone 20 mg tablet    [provider]  Prenatal Vit-Fe Fumarate-FA (PRENATAL MULTIVITAMIN) TABS Take 1 tablet by mouth daily at 12 noon.    [provider]  promethazine (PHENERGAN) 25 MG tablet promethazine 25 mg  tablet    [provider]  pseudoephedrine (SUDAFED) 30 MG tablet Take 30 mg by mouth every 4 (four) hours as needed for congestion.    [provider]  rho, d, immune globulin  (RHOPHYLAC ) 1500 UNIT/2ML SOSY injection Rhophylac  1,500 unit (300 mcg)/2 mL injection syringe    [provider]  sertraline (ZOLOFT) 100 MG tablet TK 1 T PO QD 07/26/19   [provider]  sulfamethoxazole-trimethoprim (BACTRIM DS) 800-160 MG tablet TK 1 T PO BID FOR 7 DAYS 07/24/19   [provider]    Family History Family History  Problem Relation Age of Onset   Hypertension Mother    Hypertension Maternal Grandmother    Diabetes Paternal Grandmother    Breast cancer Sister 45    Social History Social History   Tobacco Use   Smoking status: Never  Substance Use Topics   Alcohol use: No   Drug use: No     Allergies   Phentermine   Review of Systems Review of Systems  Skin:  Positive for rash.   See HPI  Physical Exam Triage Vital Signs ED Triage Vitals  Encounter Vitals Group     BP 03/31/24 1300 (!) 162/106     Systolic BP Percentile --      Diastolic BP Percentile --      Pulse Rate 03/31/24 1300 86     Resp 03/31/24 1300 20     Temp 03/31/24 1300 98.5 F (36.9 C)     Temp Source 03/31/24 1300 Oral     SpO2 03/31/24 1300 97 %     Weight --      Height --      Head Circumference --      Peak Flow --      Pain Score 03/31/24 1303 0     Pain Loc --      Pain Education --      Exclude from Growth Chart --    No data found.  Updated Vital Signs BP (!) 162/106 (BP Location: Right Arm)   Pulse 86   Temp 98.5 F (36.9 C) (Oral)   Resp 20   LMP 03/06/2024   SpO2 97%   Visual Acuity Right Eye Distance:   Left Eye Distance:   Bilateral Distance:    Right Eye Near:   Left Eye Near:    Bilateral Near:     Physical Exam Vitals and nursing note reviewed.  Constitutional:      General: She is not in acute distress.    Appearance:  Normal appearance. She is not ill-appearing, toxic-appearing or diaphoretic.  HENT:     Mouth/Throat:     Pharynx: Oropharynx is clear.  Pulmonary:  Effort: Pulmonary effort is normal.  Skin:    General: Skin is warm and dry.     Findings: No rash.     Comments: No current rash present on exam.  Patient did show picture of rash earlier today to the right forearm that has resolved.  Neurological:     Mental Status: She is alert.  Psychiatric:        Mood and Affect: Mood normal.      UC Treatments / Results  Labs (all labs ordered are listed, but only abnormal results are displayed) Labs Reviewed - No data to display  EKG   Radiology No results found.  Procedures Procedures (including critical care time)  Medications Ordered in UC Medications - No data to display  Initial Impression / Assessment and Plan / UC Course  I have reviewed the triage vital signs and the nursing notes.  Pertinent labs & imaging results that were available during my care of the patient were reviewed by me and considered in my medical decision making (see chart for details).     Rash-no obvious rash on exam today.  Patient did show me pictures of a few different flareups this week.  Believe most likely this rash is stress related.  Doubt reaction to antibiotics based on presentation of coming and going.  She is mildly anxious about this.  Recommended doing some magnesium and ashwagandha to help with anxiety.  She does have Xanax at home she can take if needed. Recommend finding ways to do stress like going for a walk and antihistamines if needed If this continues she will need to follow-up with her doctor for potential testing in case this is some sort of allergen. Final Clinical Impressions(s) / UC Diagnoses   Final diagnoses:  Rash and nonspecific skin eruption     Discharge Instructions      I believe that this rash may be stress related. Stress can do crazy things to the body. You  can try some Magnesium Glycinate and Ashwagandha to help relax. These are natural supplements. Go for a walk. I would stop the Cefdinir in case this is the cause. Less likely though. Antihistamines as needed.  Follow up with your doctor for continued issues.   ED Prescriptions   None    PDMP not reviewed this encounter.   Landa Pine, FNP 03/31/24 1904

## 2024-03-31 NOTE — Discharge Instructions (Signed)
 I believe that this rash may be stress related. Stress can do crazy things to the body. You can try some Magnesium Glycinate and Ashwagandha to help relax. These are natural supplements. Go for a walk. I would stop the Cefdinir in case this is the cause. Less likely though. Antihistamines as needed.  Follow up with your doctor for continued issues.

## 2024-07-07 ENCOUNTER — Other Ambulatory Visit: Payer: Self-pay | Admitting: Internal Medicine

## 2024-07-07 DIAGNOSIS — Z1231 Encounter for screening mammogram for malignant neoplasm of breast: Secondary | ICD-10-CM

## 2024-07-31 ENCOUNTER — Ambulatory Visit
Admission: RE | Admit: 2024-07-31 | Discharge: 2024-07-31 | Disposition: A | Discharge status: Home / Self Care | Source: Ambulatory Visit | Attending: Internal Medicine | Admitting: Internal Medicine

## 2024-07-31 DIAGNOSIS — Z1231 Encounter for screening mammogram for malignant neoplasm of breast: Secondary | ICD-10-CM

## 2024-08-03 ENCOUNTER — Other Ambulatory Visit: Payer: Self-pay | Admitting: Internal Medicine

## 2024-08-03 DIAGNOSIS — R928 Other abnormal and inconclusive findings on diagnostic imaging of breast: Secondary | ICD-10-CM

## 2024-08-03 DIAGNOSIS — N6320 Unspecified lump in the left breast, unspecified quadrant: Secondary | ICD-10-CM

## 2024-08-07 ENCOUNTER — Other Ambulatory Visit: Payer: Self-pay | Admitting: Internal Medicine

## 2024-08-07 ENCOUNTER — Ambulatory Visit
Admission: RE | Admit: 2024-08-07 | Discharge: 2024-08-07 | Disposition: A | Discharge status: Home / Self Care | Source: Ambulatory Visit | Attending: Internal Medicine | Admitting: Internal Medicine

## 2024-08-07 DIAGNOSIS — N6321 Unspecified lump in the left breast, upper outer quadrant: Secondary | ICD-10-CM

## 2024-08-07 DIAGNOSIS — R928 Other abnormal and inconclusive findings on diagnostic imaging of breast: Secondary | ICD-10-CM

## 2024-08-07 DIAGNOSIS — N6320 Unspecified lump in the left breast, unspecified quadrant: Secondary | ICD-10-CM

## 2024-08-08 ENCOUNTER — Ambulatory Visit
Admission: RE | Admit: 2024-08-08 | Discharge: 2024-08-08 | Disposition: A | Discharge status: Home / Self Care | Source: Ambulatory Visit | Attending: Internal Medicine | Admitting: Internal Medicine

## 2024-08-08 ENCOUNTER — Inpatient Hospital Stay
Admission: RE | Admit: 2024-08-08 | Discharge: 2024-08-08 | Discharge status: Home / Self Care | Source: Ambulatory Visit | Attending: Internal Medicine | Admitting: Internal Medicine

## 2024-08-08 DIAGNOSIS — N6321 Unspecified lump in the left breast, upper outer quadrant: Secondary | ICD-10-CM

## 2024-08-08 HISTORY — PX: BREAST BIOPSY: SHX20

## 2024-08-09 LAB — SURGICAL PATHOLOGY
